# Patient Record
Sex: Female | Born: 1975
Health system: Southern US, Community
[De-identification: ages and names within clinical notes are randomized; demographics above are authoritative.]

## PROBLEM LIST (undated history)

## (undated) DIAGNOSIS — B977 Papillomavirus as the cause of diseases classified elsewhere: Secondary | ICD-10-CM

## (undated) DIAGNOSIS — E282 Polycystic ovarian syndrome: Secondary | ICD-10-CM

## (undated) DIAGNOSIS — J45909 Unspecified asthma, uncomplicated: Secondary | ICD-10-CM

## (undated) HISTORY — DX: Papillomavirus as the cause of diseases classified elsewhere: B97.7

## (undated) HISTORY — DX: Polycystic ovarian syndrome: E28.2

---
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
## 2001-09-07 DIAGNOSIS — E282 Polycystic ovarian syndrome: Secondary | ICD-10-CM

## 2001-09-07 DIAGNOSIS — B977 Papillomavirus as the cause of diseases classified elsewhere: Secondary | ICD-10-CM

## 2001-09-07 HISTORY — DX: Polycystic ovarian syndrome: E28.2

## 2001-09-07 HISTORY — DX: Papillomavirus as the cause of diseases classified elsewhere: B97.7

## 2010-01-27 ENCOUNTER — Encounter: Admission: RE | Admit: 2010-01-27 | Discharge: 2010-02-06 | Payer: Self-pay | Admitting: General Surgery

## 2010-02-03 ENCOUNTER — Ambulatory Visit (HOSPITAL_COMMUNITY)
Admission: RE | Admit: 2010-02-03 | Discharge: 2010-02-03 | Payer: Self-pay | Source: Home / Self Care | Admitting: General Surgery

## 2010-03-19 ENCOUNTER — Encounter
Admission: RE | Admit: 2010-03-19 | Discharge: 2010-03-19 | Payer: Self-pay | Source: Home / Self Care | Attending: General Surgery | Admitting: General Surgery

## 2010-04-07 ENCOUNTER — Ambulatory Visit (HOSPITAL_COMMUNITY)
Admission: RE | Admit: 2010-04-07 | Discharge: 2010-04-07 | Payer: Self-pay | Source: Home / Self Care | Admitting: General Surgery

## 2010-04-07 HISTORY — PX: LAPAROSCOPIC GASTRIC BANDING: SHX1100

## 2010-07-21 LAB — PREGNANCY, URINE: Preg Test, Ur: NEGATIVE

## 2010-07-21 LAB — COMPREHENSIVE METABOLIC PANEL
ALT: 22 U/L (ref 0–35)
Alkaline Phosphatase: 86 U/L (ref 39–117)
BUN: 10 mg/dL (ref 6–23)
CO2: 28 mEq/L (ref 19–32)
Chloride: 104 mEq/L (ref 96–112)
GFR calc non Af Amer: >60 mL/min (ref >60)
Glucose, Bld: 89 mg/dL (ref 70–99)
Potassium: 3.4 mEq/L — ABNORMAL LOW (ref 3.5–5.1)
Sodium: 140 mEq/L (ref 135–145)
Total Bilirubin: 0.4 mg/dL (ref 0.3–1.2)

## 2010-07-21 LAB — DIFFERENTIAL
Basophils Absolute: 0 10*3/uL (ref 0.0–0.1)
Basophils Relative: 0 % (ref 0–1)
Eosinophils Absolute: 0.3 10*3/uL (ref 0.0–0.7)
Monocytes Relative: 8 % (ref 3–12)
Neutrophils Relative %: 43 % (ref 43–77)

## 2010-07-21 LAB — CBC
HCT: 35.8 % — ABNORMAL LOW (ref 36.0–46.0)
Hemoglobin: 11.5 g/dL — ABNORMAL LOW (ref 12.0–15.0)
MCV: 73.6 fL — ABNORMAL LOW (ref 78.0–100.0)
WBC: 7.2 10*3/uL (ref 4.0–10.5)

## 2010-07-21 LAB — SURGICAL PCR SCREEN: MRSA, PCR: NEGATIVE

## 2010-11-13 ENCOUNTER — Encounter (INDEPENDENT_AMBULATORY_CARE_PROVIDER_SITE_OTHER): Payer: Self-pay

## 2010-12-11 ENCOUNTER — Encounter (INDEPENDENT_AMBULATORY_CARE_PROVIDER_SITE_OTHER): Payer: Self-pay

## 2011-01-08 ENCOUNTER — Encounter (INDEPENDENT_AMBULATORY_CARE_PROVIDER_SITE_OTHER): Payer: Self-pay

## 2011-01-08 ENCOUNTER — Ambulatory Visit (INDEPENDENT_AMBULATORY_CARE_PROVIDER_SITE_OTHER): Payer: BC Managed Care – PPO | Admitting: Physician Assistant

## 2011-01-08 NOTE — Progress Notes (Signed)
  HISTORY: AMAREE LOISEL is a 35 y.o.female who received an AP-Standard lap-band in November 2011 by Dr. Johna Sheriff. She comes in today with no complaints of hunger or increased portion sizes. She's doing well with 15 lbs weight loss since her last appointment. She does not desire an adjustment today.  VITAL SIGNS: Filed Vitals:   01/08/11 1554  BP: 128/88    PHYSICAL EXAM: Physical exam reveals a very well-appearing 35 y.o.female in no apparent distress Neurologic: Awake, alert, oriented Psych: Bright affect, conversant Respiratory: Breathing even and unlabored. No stridor or wheezing Extremities: Atraumatic, good range of motion. Skin: Warm, Dry, no rashes Musculoskeletal: Normal gait, Joints normal  ASSESMENT: 35 y.o.  female  s/p AP-Standard lap-band.   PLAN: She appears to be in the green zone with her current fill level so we deferred an adjustment today. We'll have her back in 2 months or sooner if necessary.

## 2011-01-08 NOTE — Patient Instructions (Signed)
Return in 2 months or sooner if needed. 

## 2011-03-05 ENCOUNTER — Encounter (INDEPENDENT_AMBULATORY_CARE_PROVIDER_SITE_OTHER): Payer: BC Managed Care – PPO

## 2011-03-19 ENCOUNTER — Telehealth (INDEPENDENT_AMBULATORY_CARE_PROVIDER_SITE_OTHER): Payer: Self-pay | Admitting: General Surgery

## 2011-03-19 NOTE — Telephone Encounter (Signed)
Called pt work #, left message on vmail. Proceeded to call pt at home number, reached pt and confirmed new appt time of 11:30 with patient. She took the day off from work and would have to see [place] after her already scheduled dr appt in [place]. Stated she could not come earlier because that appt was a block way from her home.

## 2011-03-19 NOTE — Telephone Encounter (Signed)
Called home number to try to advise pt of need to change lap band appt on 11/16. Was scheduled at 1:15 in error due to clinic only being a half day schedule.

## 2011-03-19 NOTE — Telephone Encounter (Signed)
Called and left vmail to advise pt of need to move 11/16 appt from 1:15 to the morning due to half day schedule.

## 2011-03-24 ENCOUNTER — Encounter (INDEPENDENT_AMBULATORY_CARE_PROVIDER_SITE_OTHER): Payer: Self-pay | Admitting: General Surgery

## 2011-03-26 ENCOUNTER — Encounter (INDEPENDENT_AMBULATORY_CARE_PROVIDER_SITE_OTHER): Payer: BC Managed Care – PPO

## 2011-03-26 ENCOUNTER — Encounter (INDEPENDENT_AMBULATORY_CARE_PROVIDER_SITE_OTHER): Payer: Self-pay

## 2011-03-26 ENCOUNTER — Ambulatory Visit (INDEPENDENT_AMBULATORY_CARE_PROVIDER_SITE_OTHER): Payer: BC Managed Care – PPO | Admitting: Physician Assistant

## 2011-03-26 VITALS — BP 126/84 | HR 76 | Resp 14 | Ht 68.0 in | Wt 209.4 lb

## 2011-03-26 DIAGNOSIS — Z9884 Bariatric surgery status: Secondary | ICD-10-CM

## 2011-03-26 DIAGNOSIS — E669 Obesity, unspecified: Secondary | ICD-10-CM

## 2011-03-26 NOTE — Progress Notes (Signed)
  HISTORY: TARISA PAOLA is a 35 y.o.female who received an AP-Standard lap-band in November 2011 by Dr. Johna Sheriff. She comes in today with no new complaints and no desire for a fill. She says she's hungry, but only during her menstrual period. No vomiting or regurgitation.  VITAL SIGNS: Filed Vitals:   03/26/11 1148  BP: 126/84  Pulse: 76  Resp: 14    PHYSICAL EXAM: Physical exam reveals a very well-appearing 35 y.o.female in no apparent distress Neurologic: Awake, alert, oriented Psych: Bright affect, conversant Respiratory: Breathing even and unlabored. No stridor or wheezing Extremities: Atraumatic, good range of motion. Skin: Warm, Dry, no rashes Musculoskeletal: Normal gait, Joints normal  ASSESMENT: 35 y.o.  female  s/p AP-Standard lap-band.   PLAN: She deferred an adjustment today as she continues to lose weight steadily. We'll have her back in 3 months or sooner if needed.

## 2011-03-26 NOTE — Patient Instructions (Signed)
Return in 3 months or sooner if needed.

## 2011-04-09 ENCOUNTER — Encounter (INDEPENDENT_AMBULATORY_CARE_PROVIDER_SITE_OTHER): Payer: BC Managed Care – PPO

## 2011-07-15 ENCOUNTER — Ambulatory Visit (INDEPENDENT_AMBULATORY_CARE_PROVIDER_SITE_OTHER): Payer: BC Managed Care – PPO | Admitting: Physician Assistant

## 2011-07-15 ENCOUNTER — Encounter (INDEPENDENT_AMBULATORY_CARE_PROVIDER_SITE_OTHER): Payer: Self-pay

## 2011-07-15 NOTE — Patient Instructions (Signed)
Return in six months or sooner if needed. 

## 2011-07-15 NOTE — Progress Notes (Signed)
  HISTORY: Desiree Black is a 36 y.o.female who received an AP-Standard lap-band in November 2011 by Dr. Johna Sheriff. She has no new complaints today. She denies persistent regurgitation symptoms beyond the sinus drainage issues she's had for several years. She's tolerating solids fine. She does not want to lose too much more weight but desired to tone up somewhat. She's aware that it requires increasing her physical activity.  VITAL SIGNS: Filed Vitals:   07/15/11 0945  BP: 120/72  Pulse: 66  Temp: 96.9 F (36.1 C)  Resp: 16    PHYSICAL EXAM: Physical exam reveals a very well-appearing 36 y.o.female in no apparent distress Neurologic: Awake, alert, oriented Psych: Bright affect, conversant Respiratory: Breathing even and unlabored. No stridor or wheezing Extremities: Atraumatic, good range of motion. Skin: Warm, Dry, no rashes Musculoskeletal: Normal gait, Joints normal  ASSESMENT: 36 y.o.  female  s/p AP-Standard lap-band.   PLAN: We deferred an adjustment today as she looks to be doing well with the current fill volume. We'll have her back in six months or sooner if needed. In the meantime I suggested she begin a physical activity program so she can better reach her goals.

## 2012-01-13 ENCOUNTER — Ambulatory Visit (INDEPENDENT_AMBULATORY_CARE_PROVIDER_SITE_OTHER): Payer: BC Managed Care – PPO | Admitting: Physician Assistant

## 2012-01-13 ENCOUNTER — Encounter (INDEPENDENT_AMBULATORY_CARE_PROVIDER_SITE_OTHER): Payer: Self-pay

## 2012-01-13 NOTE — Patient Instructions (Signed)
Return in 3 months or sooner if needed.

## 2012-01-13 NOTE — Progress Notes (Signed)
  HISTORY: Desiree Black is a 36 y.o.female who received an AP-Standard lap-band in December 2011 by Dr. Johna Sheriff. She comes in with no new complaints. Overall her hunger is under good control as are her portion sizes. She's engaged in a frequent exercise program. She denies persistent regurgitation or reflux. She does not want an adjustment today.  VITAL SIGNS: Filed Vitals:   01/13/12 0927  BP: 120/84  Pulse: 60  Temp: 97 F (36.1 C)  Resp: 14    PHYSICAL EXAM: Physical exam reveals a very well-appearing 36 y.o.female in no apparent distress Neurologic: Awake, alert, oriented Psych: Bright affect, conversant Respiratory: Breathing even and unlabored. No stridor or wheezing Extremities: Atraumatic, good range of motion. Skin: Warm, Dry, no rashes Musculoskeletal: Normal gait, Joints normal  ASSESMENT: 36 y.o.  female  s/p AP-Standard lap-band.   PLAN: We deferred an adjustment today as she still appears to be in the green zone. We'll have her back in three months or sooner if needed.

## 2012-02-09 ENCOUNTER — Encounter (HOSPITAL_COMMUNITY): Payer: Self-pay | Admitting: Cardiology

## 2012-02-09 ENCOUNTER — Emergency Department (HOSPITAL_COMMUNITY)
Admission: EM | Admit: 2012-02-09 | Discharge: 2012-02-09 | Disposition: A | Discharge status: Home / Self Care | Payer: BC Managed Care – PPO | Attending: Emergency Medicine | Admitting: Emergency Medicine

## 2012-02-09 ENCOUNTER — Emergency Department (HOSPITAL_COMMUNITY): Payer: BC Managed Care – PPO

## 2012-02-09 DIAGNOSIS — S161XXA Strain of muscle, fascia and tendon at neck level, initial encounter: Secondary | ICD-10-CM

## 2012-02-09 DIAGNOSIS — S139XXA Sprain of joints and ligaments of unspecified parts of neck, initial encounter: Secondary | ICD-10-CM | POA: Insufficient documentation

## 2012-02-09 MED ORDER — NAPROXEN 500 MG PO TABS: 500.0000 mg | ORAL_TABLET | Freq: Two times a day (BID) | ORAL | Status: DC | PRN

## 2012-02-09 MED ORDER — CYCLOBENZAPRINE HCL 10 MG PO TABS: 10.0000 mg | ORAL_TABLET | Freq: Three times a day (TID) | ORAL | Status: DC | PRN

## 2012-02-09 NOTE — ED Notes (Signed)
Pt ambulated in the hallway without any difficulty.
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
## 2012-04-20 ENCOUNTER — Encounter (INDEPENDENT_AMBULATORY_CARE_PROVIDER_SITE_OTHER): Payer: BC Managed Care – PPO

## 2012-05-25 ENCOUNTER — Encounter (INDEPENDENT_AMBULATORY_CARE_PROVIDER_SITE_OTHER): Payer: BC Managed Care – PPO

## 2013-02-19 ENCOUNTER — Encounter: Payer: Self-pay | Admitting: Nurse Practitioner

## 2013-02-19 ENCOUNTER — Ambulatory Visit (INDEPENDENT_AMBULATORY_CARE_PROVIDER_SITE_OTHER): Payer: BC Managed Care – PPO | Admitting: Nurse Practitioner

## 2013-02-19 VITALS — BP 118/88 | HR 80 | Ht 68.25 in | Wt 193.2 lb

## 2013-02-19 DIAGNOSIS — Z01419 Encounter for gynecological examination (general) (routine) without abnormal findings: Secondary | ICD-10-CM

## 2013-02-19 DIAGNOSIS — Z0000 Encounter for general adult medical examination without abnormal findings: Secondary | ICD-10-CM

## 2013-02-19 DIAGNOSIS — F3281 Premenstrual dysphoric disorder: Secondary | ICD-10-CM

## 2013-02-19 DIAGNOSIS — N943 Premenstrual tension syndrome: Secondary | ICD-10-CM

## 2013-02-22 ENCOUNTER — Encounter: Payer: Self-pay | Admitting: Nurse Practitioner

## 2013-02-22 DIAGNOSIS — F3281 Premenstrual dysphoric disorder: Secondary | ICD-10-CM | POA: Insufficient documentation

## 2013-02-22 NOTE — Progress Notes (Signed)
  Subjective:    Patient ID: Desiree Black, female    DOB: August 16, 1975, 37 y.o.   MRN: [id number]  HPI Presents for wellness checkup. Had lap band in 2012. Continues to lose weight. Cycles reg with heavy flow lasting 4-5 days. Same sexual partner. PCOS improved. Regular dental care. Intense PMS symptoms beginning about 2 weeks before cycle including agitation and emotional lability.     Review of Systems  Constitutional: Negative for activity change, appetite change and fatigue.  HENT: Negative for dental problem, hearing loss, rhinorrhea, sinus pressure and sore throat.   Eyes: Negative for visual disturbance.  Respiratory: Negative for cough, chest tightness, shortness of breath and wheezing.   Cardiovascular: Negative for chest pain.  Gastrointestinal: Negative for nausea, vomiting, abdominal pain, diarrhea, constipation and blood in stool.  Genitourinary: Negative for dysuria, urgency, frequency, vaginal discharge, difficulty urinating, menstrual problem and pelvic pain.  Psychiatric/Behavioral: Positive for dysphoric mood. Negative for suicidal ideas and sleep disturbance.       Objective:   Physical Exam  Vitals reviewed. Constitutional: She is oriented to person, place, and time. She appears well-developed. No distress.  HENT:  Right Ear: External ear normal.  Left Ear: External ear normal.  Mouth/Throat: Oropharynx is clear and moist.  Neck: Normal range of motion. Neck supple. No tracheal deviation present. No thyromegaly present.  Cardiovascular: Normal rate, regular rhythm and normal heart sounds.  Exam reveals no gallop.   No murmur heard. Pulmonary/Chest: Effort normal and breath sounds normal.  Abdominal: Soft. She exhibits no distension. There is no tenderness.  Genitourinary: Vagina normal and uterus normal. No vaginal discharge found.  Musculoskeletal: She exhibits no edema.  Lymphadenopathy:    She has no cervical adenopathy.  Neurological: She is alert and  oriented to person, place, and time.  Skin: Skin is warm and dry. No rash noted.  Psychiatric: She has a normal mood and affect. Her behavior is normal.  Breast exam: dense tissue; no masses; axillae no adenopathy. GU: external exam normal; vagina no discharge Bimanual exam: normal        Assessment & Plan:  Well woman exam  Routine general medical examination at a health care facility - Plan: Basic metabolic panel, Hepatic function panel, Lipid panel, TSH, Vit D  25 hydroxy (rtn osteoporosis monitoring)  PMDD (premenstrual dysphoric disorder)  Recommend daily calcium and vitamin D. Healthy diet. Regular activity. Next physical in one year. Consider medication for anxiety and PMDD. '

## 2013-11-06 ENCOUNTER — Other Ambulatory Visit: Payer: Self-pay | Admitting: Nurse Practitioner

## 2013-11-06 ENCOUNTER — Telehealth: Payer: Self-pay | Admitting: Nurse Practitioner

## 2013-11-06 MED ORDER — DROSPIRENONE-ETHINYL ESTRADIOL 3-0.02 MG PO TABS: 1.0000 | ORAL_TABLET | Freq: Every day | ORAL | Status: DC

## 2013-11-06 NOTE — Telephone Encounter (Signed)
Pt states that at her last appt she was supposed to get a script for  Yaz to help control her hormones   [facility]   Last seen 10/14

## 2013-11-07 NOTE — Telephone Encounter (Signed)
Med sent to pharmacy.

## 2013-11-16 ENCOUNTER — Other Ambulatory Visit: Payer: Self-pay | Admitting: *Deleted

## 2013-11-16 ENCOUNTER — Telehealth: Payer: Self-pay | Admitting: Family Medicine

## 2013-11-16 MED ORDER — DROSPIRENONE-ETHINYL ESTRADIOL 3-0.02 MG PO TABS: 1.0000 | ORAL_TABLET | Freq: Every day | ORAL | Status: DC

## 2013-11-16 NOTE — Telephone Encounter (Signed)
Patient called today to check on her Rx for birth control, but she had no idea it was called in because she waiting on a phone call. Can we recall this in to [facility]?

## 2013-11-16 NOTE — Telephone Encounter (Signed)
Med sent to pharm on June 30th. Pt notified. Med resent today incase pharm has put rx back.

## 2013-11-19 ENCOUNTER — Encounter: Payer: Self-pay | Admitting: Adult Health

## 2013-11-19 ENCOUNTER — Ambulatory Visit (INDEPENDENT_AMBULATORY_CARE_PROVIDER_SITE_OTHER): Payer: BC Managed Care – PPO | Admitting: Adult Health

## 2013-11-19 DIAGNOSIS — Z3201 Encounter for pregnancy test, result positive: Secondary | ICD-10-CM

## 2013-11-19 LAB — POCT URINE PREGNANCY: PREG TEST UR: POSITIVE

## 2013-11-19 NOTE — Addendum Note (Signed)
Addended by: Linton Rump on: 11/19/2013 03:45 PM   Modules accepted: Level of Service

## 2014-02-20 ENCOUNTER — Ambulatory Visit (INDEPENDENT_AMBULATORY_CARE_PROVIDER_SITE_OTHER): Payer: BC Managed Care – PPO | Admitting: Nurse Practitioner

## 2014-02-20 ENCOUNTER — Encounter: Payer: Self-pay | Admitting: Nurse Practitioner

## 2014-02-20 VITALS — BP 130/82 | Ht 68.25 in | Wt 180.0 lb

## 2014-02-20 DIAGNOSIS — Z3009 Encounter for other general counseling and advice on contraception: Secondary | ICD-10-CM

## 2014-02-20 DIAGNOSIS — S8001XA Contusion of right knee, initial encounter: Secondary | ICD-10-CM

## 2014-02-20 NOTE — Progress Notes (Signed)
Subjective:  Presents for complaints of a protruding area near the right kneecap. [place]. Had some swelling and pain initially, this has completely resolved. No pain with any type of activity. In July, patient terminated a pregnancy taking oral medication provided by [facility]. Had bleeding for about 3 weeks. Now having regular menses light flow lasting about 5 days. Decided not to take birth control pills, did not provide a specific reason. Very unsure whether she wants to have any children. Her current partner has 4 children. Is interested in Depo-Provera.  Objective:   BP 130/82  Ht 5' 8.25" (1.734 m)  Wt 180 lb (81.647 kg)  BMI 27.15 kg/m2 NAD. Alert, oriented. Right knee no edema. A small bony prominences noted beneath the patella and tibial tuberosity, minimal tenderness. Faint hyperpigmentation and mild keloid tissue noted from injury. Good ROM of the knee without tenderness, moderate crepitus noted in both knees.  Assessment: Contusion of right knee, initial encounter  Encounter for other general counseling or advice on contraception  Plan: Expect continued gradual resolution of slight protrusion on her knee, explained this is probably residual from her contusion. May take months for it to improve. Defers x-ray at this time since she is not having any symptoms. Discussed contraceptive options at length. Patient is very undecided about pregnancy issue. She has decided if she has a regular menses on 10/30 that she will schedule a nurse visit to get Depo-Provera.

## 2014-06-27 ENCOUNTER — Ambulatory Visit (INDEPENDENT_AMBULATORY_CARE_PROVIDER_SITE_OTHER): Payer: BLUE CROSS/BLUE SHIELD | Admitting: Nurse Practitioner

## 2014-06-27 ENCOUNTER — Encounter: Payer: Self-pay | Admitting: Nurse Practitioner

## 2014-06-27 VITALS — BP 130/82 | Ht 68.25 in | Wt 185.0 lb

## 2014-06-27 DIAGNOSIS — N946 Dysmenorrhea, unspecified: Secondary | ICD-10-CM

## 2014-06-27 DIAGNOSIS — N943 Premenstrual tension syndrome: Secondary | ICD-10-CM

## 2014-06-27 DIAGNOSIS — N920 Excessive and frequent menstruation with regular cycle: Secondary | ICD-10-CM

## 2014-06-27 DIAGNOSIS — D500 Iron deficiency anemia secondary to blood loss (chronic): Secondary | ICD-10-CM

## 2014-06-27 DIAGNOSIS — F3281 Premenstrual dysphoric disorder: Secondary | ICD-10-CM

## 2014-06-27 MED ORDER — MEDROXYPROGESTERONE ACETATE 150 MG/ML IM SUSP
150.0000 mg | Freq: Once | INTRAMUSCULAR | Status: AC
  Administered 2014-06-27: 150 mg via INTRAMUSCULAR

## 2014-06-27 NOTE — Patient Instructions (Signed)
Take iron twice a day with fruit juice; do not take with milk or tea  Iron-Rich Diet An iron-rich diet contains foods that are good sources of iron. Iron is an important mineral that helps your body produce hemoglobin. Hemoglobin is a protein in red blood cells that carries oxygen to the body's tissues. Sometimes, the iron level in your blood can be low. This may be caused by:  A lack of iron in your diet.  Blood loss.  Times of growth, such as during pregnancy or during a child's growth and development. Low levels of iron can cause a decrease in the number of red blood cells. This can result in iron deficiency anemia. Iron deficiency anemia symptoms include:  Tiredness.  Weakness.  Irritability.  Increased chance of infection. Here are some recommendations for daily iron intake:  Males older than 37 years of age need 8 mg of iron per day.  Women ages 30 to 61 need 18 mg of iron per day.  Pregnant women need 27 mg of iron per day, and women who are over 95 years of age and breastfeeding need 9 mg of iron per day.  Women over the age of 33 need 8 mg of iron per day. SOURCES OF IRON There are 2 types of iron that are found in food: heme iron and nonheme iron. Heme iron is absorbed by the body better than nonheme iron. Heme iron is found in meat, poultry, and fish. Nonheme iron is found in grains, beans, and vegetables. Heme Iron Sources Food / Iron (mg)  Chicken liver, 3 oz (85 g)/ 10 mg  Beef liver, 3 oz (85 g)/ 5.5 mg  Oysters, 3 oz (85 g)/ 8 mg  Beef, 3 oz (85 g)/ 2 to 3 mg  Shrimp, 3 oz (85 g)/ 2.8 mg  [place], 3 oz (85 g)/ 2 mg  Chicken, 3 oz (85 g) / 1 mg  Fish (tuna, halibut), 3 oz (85 g)/ 1 mg  Pork, 3 oz (85 g)/ 0.9 mg Nonheme Iron Sources Food / Iron (mg)  Ready-to-eat breakfast cereal, iron-fortified / 3.9 to 7 mg  Tofu,  cup / 3.4 mg  Kidney beans,  cup / 2.6 mg  Baked potato with skin / 2.7 mg  Asparagus,  cup / 2.2 mg  Avocado / 2  mg  Dried peaches,  cup / 1.6 mg  Raisins,  cup / 1.5 mg  Soy milk, 1 cup / 1.5 mg  Whole-wheat bread, 1 slice / 1.2 mg  Spinach, 1 cup / 0.8 mg  Broccoli,  cup / 0.6 mg IRON ABSORPTION Certain foods can decrease the body's absorption of iron. Try to avoid these foods and beverages while eating meals with iron-containing foods:  Coffee.  Tea.  Fiber.  Soy. Foods containing vitamin C can help increase the amount of iron your body absorbs from iron sources, especially from nonheme sources. Eat foods with vitamin C along with iron-containing foods to increase your iron absorption. Foods that are high in vitamin C include many fruits and vegetables. Some good sources are:  Fresh orange juice.  Oranges.  Strawberries.  Mangoes.  Grapefruit.  Red bell peppers.  Green bell peppers.  Broccoli.  Potatoes with skin.  Tomato juice. Document Released: 12/08/2004 Document Revised: 07/19/2011 Document Reviewed: 10/15/2010 [facility] Patient Information 2015 [place], [place]. This information is not intended to replace advice given to you by your health care provider. Make sure you discuss any questions you have with your health care provider.

## 2014-07-01 ENCOUNTER — Encounter: Payer: Self-pay | Admitting: Nurse Practitioner

## 2014-07-01 DIAGNOSIS — N920 Excessive and frequent menstruation with regular cycle: Secondary | ICD-10-CM | POA: Insufficient documentation

## 2014-07-01 DIAGNOSIS — D500 Iron deficiency anemia secondary to blood loss (chronic): Secondary | ICD-10-CM | POA: Insufficient documentation

## 2014-07-01 NOTE — Progress Notes (Signed)
Subjective:  Presents for recheck on PMDD. Regular cycles lasting 4-5 days, heavy flow and clots; cramping and pain. On second day of normal cycle. History of anemia due to heavy cycles.  Objective:   BP 130/82 mmHg  Ht 5' 8.25" (1.734 m)  Wt 185 lb (83.915 kg)  BMI 27.91 kg/m2 NAD. Alert, oriented. Lungs clear. Heart RRR. See scanned labs dated 05/20/14. Hgb 9.1.     Assessment:  Problem List Items Addressed This Visit      Other   PMDD (premenstrual dysphoric disorder) - Primary   Relevant Medications   medroxyPROGESTERone (DEPO-PROVERA) injection 150 mg (Completed)    Other Visit Diagnoses    Menorrhagia with regular cycle        Relevant Medications    medroxyPROGESTERone (DEPO-PROVERA) injection 150 mg (Completed)    Dysmenorrhea        Relevant Medications    medroxyPROGESTERone (DEPO-PROVERA) injection 150 mg (Completed)    Iron deficiency anemia due to chronic blood loss        Relevant Medications    medroxyPROGESTERone (DEPO-PROVERA) injection 150 mg (Completed)      Plan:  Meds ordered this encounter  Medications  . medroxyPROGESTERone (DEPO-PROVERA) injection 150 mg    Sig:    Start daily OTC iron therapy as directed. Call back if any heavy or prolonged bleeding. Will wait to see if this will resolve all of her issues including PMDD. Call back if any problems. Reminded about wellness exam.

## 2014-07-26 ENCOUNTER — Encounter: Payer: Self-pay | Admitting: Nurse Practitioner

## 2014-07-26 ENCOUNTER — Ambulatory Visit (INDEPENDENT_AMBULATORY_CARE_PROVIDER_SITE_OTHER): Payer: BLUE CROSS/BLUE SHIELD | Admitting: Nurse Practitioner

## 2014-07-26 VITALS — BP 140/90 | Ht 68.0 in | Wt 183.0 lb

## 2014-07-26 DIAGNOSIS — N943 Premenstrual tension syndrome: Secondary | ICD-10-CM

## 2014-07-26 DIAGNOSIS — F3281 Premenstrual dysphoric disorder: Secondary | ICD-10-CM

## 2014-07-26 DIAGNOSIS — D500 Iron deficiency anemia secondary to blood loss (chronic): Secondary | ICD-10-CM | POA: Diagnosis not present

## 2014-07-26 DIAGNOSIS — J302 Other seasonal allergic rhinitis: Secondary | ICD-10-CM

## 2014-07-26 LAB — POCT HEMOGLOBIN: Hemoglobin: 10.6 g/dL — AB (ref 12.2–16.2)

## 2014-07-26 NOTE — Patient Instructions (Signed)
Antihistamine Nasacort AQ as directed Zaditor eye drops

## 2014-07-27 ENCOUNTER — Encounter: Payer: Self-pay | Admitting: Nurse Practitioner

## 2014-07-27 NOTE — Progress Notes (Signed)
Subjective:  Presents for recheck. No cycle since starting Depo Provera. No PMDD symptoms. Less angry. Still having some issues due to stress from her job. States she "hates her job". Has applied for other jobs. Taking iron supplement. Mild head congestion. Ear pressure. Sneezing. No fever or cough. No headache.   Objective:   BP 140/90 mmHg  Ht 5\' 8"  (1.727 m)  Wt 183 lb (83.008 kg)  BMI 27.83 kg/m2  LMP 06/27/2014 NAD. Alert, oriented. TMs retracted; no erythema. Pharynx clear. Neck supple with mild adenopathy. Lungs clear. Heart RRR.  Results for orders placed or performed in visit on 07/26/14  POCT hemoglobin  Result Value Ref Range   Hemoglobin 10.6 (A) 12.2 - 16.2 g/dL   Last Hgb in 05/20/14 was 9.1.  Assessment:  Problem List Items Addressed This Visit      Other   PMDD (premenstrual dysphoric disorder)   Iron deficiency anemia due to chronic blood loss - Primary   Relevant Medications   Iron TABS   Other Relevant Orders   POCT hemoglobin (Completed)    Other Visit Diagnoses    Other seasonal allergic rhinitis          Plan: anemia has improved; continue iron supplement for at least 2 more months. Continue Depo Provera. OTC meds as directed for allergies.  Return if symptoms worsen or fail to improve.

## 2014-07-29 ENCOUNTER — Encounter: Payer: Self-pay | Admitting: *Deleted

## 2014-09-19 DIAGNOSIS — Z029 Encounter for administrative examinations, unspecified: Secondary | ICD-10-CM

## 2014-10-02 ENCOUNTER — Ambulatory Visit: Payer: BLUE CROSS/BLUE SHIELD | Admitting: Nurse Practitioner

## 2015-09-02 ENCOUNTER — Ambulatory Visit: Payer: BLUE CROSS/BLUE SHIELD | Admitting: Nurse Practitioner

## 2016-02-13 ENCOUNTER — Encounter (HOSPITAL_COMMUNITY): Payer: Self-pay

## 2016-11-08 ENCOUNTER — Encounter (HOSPITAL_COMMUNITY): Payer: Self-pay

## 2017-06-13 ENCOUNTER — Encounter: Payer: BLUE CROSS/BLUE SHIELD | Admitting: Nurse Practitioner

## 2017-06-29 ENCOUNTER — Encounter: Payer: Self-pay | Admitting: Nurse Practitioner

## 2017-06-29 ENCOUNTER — Ambulatory Visit (INDEPENDENT_AMBULATORY_CARE_PROVIDER_SITE_OTHER): Payer: BLUE CROSS/BLUE SHIELD | Admitting: Nurse Practitioner

## 2017-06-29 VITALS — BP 128/84 | Ht 68.0 in | Wt 178.2 lb

## 2017-06-29 DIAGNOSIS — D500 Iron deficiency anemia secondary to blood loss (chronic): Secondary | ICD-10-CM | POA: Diagnosis not present

## 2017-06-29 DIAGNOSIS — Z124 Encounter for screening for malignant neoplasm of cervix: Secondary | ICD-10-CM | POA: Diagnosis not present

## 2017-06-29 DIAGNOSIS — Z01419 Encounter for gynecological examination (general) (routine) without abnormal findings: Secondary | ICD-10-CM | POA: Diagnosis not present

## 2017-06-29 DIAGNOSIS — Z1151 Encounter for screening for human papillomavirus (HPV): Secondary | ICD-10-CM | POA: Diagnosis not present

## 2017-06-29 DIAGNOSIS — Z113 Encounter for screening for infections with a predominantly sexual mode of transmission: Secondary | ICD-10-CM | POA: Diagnosis not present

## 2017-06-29 DIAGNOSIS — D367 Benign neoplasm of other specified sites: Secondary | ICD-10-CM | POA: Diagnosis not present

## 2017-06-29 DIAGNOSIS — Z13220 Encounter for screening for lipoid disorders: Secondary | ICD-10-CM

## 2017-06-29 DIAGNOSIS — E559 Vitamin D deficiency, unspecified: Secondary | ICD-10-CM

## 2017-06-29 MED ORDER — NORETHINDRONE ACET-ETHINYL EST 1-20 MG-MCG PO TABS: 1.0000 | ORAL_TABLET | Freq: Every day | ORAL | 11 refills | Status: DC

## 2017-06-29 NOTE — Patient Instructions (Addendum)
For constipation increase fluid intake, start taking Benefiber tablets, eat foods with magnesium in them like pumpkin seeds, molasses, spinach, dark chocolate, bananas, and brown rice. For breast nodularity try to limit caffeine intake and get mammogram once you turn 40.  Oral Contraception Information Oral contraceptive pills (OCPs) are medicines taken to prevent pregnancy. OCPs work by preventing the ovaries from releasing eggs. The hormones in OCPs also cause the cervical mucus to thicken, preventing the sperm from entering the uterus. The hormones also cause the uterine lining to become thin, not allowing a fertilized egg to attach to the inside of the uterus. OCPs are highly effective when taken exactly as prescribed. However, OCPs do not prevent sexually transmitted diseases (STDs). Safe sex practices, such as using condoms along with the pill, can help prevent STDs. Before taking the pill, you may have a physical exam and Pap test. Your health care provider may order blood tests. The health care provider will make sure you are a good candidate for oral contraception. Discuss with your health care provider the possible side effects of the OCP you may be prescribed. When starting an OCP, it can take 2 to 3 months for the body to adjust to the changes in hormone levels in your body. Types of oral contraception  The combination pill-This pill contains estrogen and progestin (synthetic progesterone) hormones. The combination pill comes in 21-day, 28-day, or 91-day packs. Some types of combination pills are meant to be taken continuously (365-day pills). With 21-day packs, you do not take pills for 7 days after the last pill. With 28-day packs, the pill is taken every day. The last 7 pills are without hormones. Certain types of pills have more than 21 hormone-containing pills. With 91-day packs, the first 84 pills contain both hormones, and the last 7 pills contain no hormones or contain estrogen  only.  The minipill-This pill contains the progesterone hormone only. The pill is taken every day continuously. It is very important to take the pill at the same time each day. The minipill comes in packs of 28 pills. All 28 pills contain the hormone. Advantages of oral contraceptive pills  Decreases premenstrual symptoms.  Treats menstrual period cramps.  Regulates the menstrual cycle.  Decreases a heavy menstrual flow.  May treatacne, depending on the type of pill.  Treats abnormal uterine bleeding.  Treats polycystic ovarian syndrome.  Treats endometriosis.  Can be used as emergency contraception. Things that can make oral contraceptive pills less effective OCPs can be less effective if:  You forget to take the pill at the same time every day.  You have a stomach or intestinal disease that lessens the absorption of the pill.  You take OCPs with other medicines that make OCPs less effective, such as antibiotics, certain HIV medicines, and some seizure medicines.  You take expired OCPs.  You forget to restart the pill on day 7, when using the packs of 21 pills.  Risks associated with oral contraceptive pills Oral contraceptive pills can sometimes cause side effects, such as:  Headache.  Nausea.  Breast tenderness.  Irregular bleeding or spotting.  Combination pills are also associated with a small increased risk of:  Blood clots.  Heart attack.  Stroke.  This information is not intended to replace advice given to you by your health care provider. Make sure you discuss any questions you have with your health care provider. Document Released: 07/17/2002 Document Revised: 10/02/2015 Document Reviewed: 10/15/2012 Elsevier Interactive Patient Education  [employer].

## 2017-06-29 NOTE — Progress Notes (Signed)
Subjective:    Patient ID: Desiree Black, female    DOB: Aug 26, 1975, 42 y.o.   MRN: [id number]   CC: annual exam  HPI: 42 year old female presents here for annual exam.  She is concerned about "fatty tumor" on the back of her left thigh which she noticed two years ago.  It has not increased in size, there is no pain, redness, or swelling of the mass or the leg.  She attests to some mild itching in the mass that does not radiate anywhere.  She has not seen anyone about the mass.  She also attests to some constipation that occurs every one to two months.  She has pain with defecation and a small amount of blood noted on toilet paper after wiping.  She states that the constipation started after her weight loss surgery.  She has not tried anything to treat it.  Nothing makes it better or worse.  She would also like to discuss some hormone therapy to help control her premenstrual symptoms.  Past medical history: HPV, PCOS, PMDD, iron deficiency anemia due to chronic blood loss  Past Surgical History:  Procedure Laterality Date  . BREAST REDUCTION SURGERY  1998  . LAPAROSCOPIC GASTRIC BANDING  04/07/10   Accidents/injuries: none in the past three months Hospitalizations: none in the past three months  Current Outpatient Medications on File Prior to Visit  Medication Sig Dispense Refill  . Iron TABS Take by mouth.     No current facility-administered medications on file prior to visit.    Family History  Problem Relation Age of Onset  . Hypertension Mother   . Gout Maternal Grandmother   . Hypertension Maternal Grandmother   . Hypertension Brother   . Hypertension Sister    No Known Allergies   Immunizations: Has not had influenza vaccine.  Otherwise immunizations UTD.  Social History   Tobacco Use  . Smoking status: Never Smoker  . Smokeless tobacco: Never Used  Substance Use Topics  . Alcohol use: No  . Drug use: No   Sexual history: currently has no sexual  partners. Diet: reports that she eats pretty healthy and cooks for herself Travel: has not traveled outside the [place] in the last six months. Exercise: walks two blocks to work everyday;  Wants to start an exercise routine. LMP: about a month ago. Reports periods are regular, occurring about every month.  Her periods alternate between a light flow and a heavy flow every other month "like clockwork."    Reports she has premenstrual symptoms such as headache, fatigue, and breast tenderness up to two weeks before her cycle starts.   Review of Systems  Constitutional: Negative for appetite change.  Respiratory: Negative for cough, chest tightness, shortness of breath and wheezing.   Cardiovascular: Negative for chest pain and leg swelling.  Gastrointestinal: Positive for constipation. Negative for abdominal pain, diarrhea, nausea and vomiting.  Genitourinary: Negative for genital sores, pelvic pain, vaginal bleeding and vaginal discharge.       Objective:   Physical Exam   Blood pressure 128/84, height 5\' 8"  (1.727 m), weight 178 lb 3.2 oz (80.8 kg). Body mass index is 27.1 kg/m.  Constitutional:  Not in acute distress. HEENT: Ear canals clear. Bilateral TM: pearly gray, BLM visible.  R TM mildly retracted no erythema.  Thyroid isthmus midline, lobes palpable but not enlarged.  No palpable lymph nodes.   Skin:  19cm X 20cm mass on back of left thigh.  Mass is  soft, no tenderness on palpation.  No redness, edema, or difference in temperature of mass or skin around it.   Respiratory: Lungs clear no adventitious sounds. Cardiovascular:  S1S2 auscultated, no adventitious sounds. GI:  Active bowel sounds. No tenderness or masses on palpation. Non-distended. GU:  External genitalia intact no lesions, redness, edema, or exudate.  Vaginal mucosa pink.  Cervix friable with scant cloudy mucus like discharge. No CMT, no tenderness or obvious masses on bimanual exam. Breast:  Diffuse fine nodularity,  dense tissue palpated bilateral breasts.  More nodularity on left breast.  No large masses palpated. No swollen or tender lymph nodes in axillary area.  No pain on palpation.  Old surgical scars under each breast.  No erythema, swelling, or lesions.  No skin or nipple changes.       Assessment & Plan:   Problem List Items Addressed This Visit      Other   Iron deficiency anemia due to chronic blood loss   Relevant Orders   CBC with Differential   Vitamin D (25 hydroxy)   TSH   Basic Metabolic Panel (BMET)   Lipid Profile   Hepatic function panel    Other Visit Diagnoses    Well woman exam    -  Primary   Relevant Orders   Pap IG, CT/NG NAA, and HPV (high risk)   Need for lipid screening       Relevant Orders   CBC with Differential   Vitamin D (25 hydroxy)   TSH   Basic Metabolic Panel (BMET)   Lipid Profile   Hepatic function panel   Vitamin D deficiency       Relevant Orders   CBC with Differential   Vitamin D (25 hydroxy)   TSH   Basic Metabolic Panel (BMET)   Lipid Profile   Hepatic function panel   Screening for cervical cancer       Relevant Orders   Pap IG, CT/NG NAA, and HPV (high risk)   Screening for HPV (human papillomavirus)       Relevant Orders   Pap IG, CT/NG NAA, and HPV (high risk)   Screen for STD (sexually transmitted disease)       Relevant Orders   Pap IG, CT/NG NAA, and HPV (high risk)   Cyst, dermoid, leg, left       Relevant Orders   Ambulatory referral to General Surgery     Meds ordered this encounter  Medications  . norethindrone-ethinyl estradiol (MICROGESTIN,JUNEL,LOESTRIN) 1-20 MG-MC[facility] tablet    Sig: Take 1 tablet by mouth daily.    Dispense:  1 Package    Refill:  11    Order Specific Question:   Supervising Provider    Answer:   Mikey Kirschner [[id number]]   Referred to [facility] to assess mass on posterior left thigh.  Education:  Gave her information on oral contraceptive pills.  Counseled on natural remedies for constipation  such as increased fluid intake, exercise, eating more fiber, and eating foods high in magnesium. She can take Benefiber tablets as well. For breast nodularity try to limit caffeine intake and get mammogram once you turn 42. Discussed potential risks associated with oc use including risk of DVT. Non smoker.  Return in about 1 year (around 06/29/2018) for physical.

## 2017-07-01 ENCOUNTER — Other Ambulatory Visit: Payer: Self-pay | Admitting: Nurse Practitioner

## 2017-07-01 MED ORDER — AZITHROMYCIN 250 MG PO TABS: ORAL_TABLET | ORAL | 0 refills | Status: DC

## 2017-07-02 LAB — PAP IG, CT-NG NAA, HPV HIGH-RISK
Chlamydia, Nuc. Acid Amp: POSITIVE — AB
Gonococcus by Nucleic Acid Amp: NEGATIVE
HPV, HIGH-RISK: NEGATIVE
PAP Smear Comment: 0

## 2017-07-05 ENCOUNTER — Encounter: Payer: Self-pay | Admitting: Family Medicine

## 2017-08-08 DIAGNOSIS — R2240 Localized swelling, mass and lump, unspecified lower limb: Secondary | ICD-10-CM | POA: Diagnosis not present

## 2017-08-10 ENCOUNTER — Other Ambulatory Visit: Payer: Self-pay | Admitting: General Surgery

## 2017-08-10 DIAGNOSIS — R2242 Localized swelling, mass and lump, left lower limb: Secondary | ICD-10-CM

## 2017-08-25 ENCOUNTER — Ambulatory Visit
Admission: RE | Admit: 2017-08-25 | Discharge: 2017-08-25 | Disposition: A | Discharge status: Home / Self Care | Payer: BLUE CROSS/BLUE SHIELD | Source: Ambulatory Visit | Attending: General Surgery | Admitting: General Surgery

## 2017-08-25 DIAGNOSIS — R2242 Localized swelling, mass and lump, left lower limb: Secondary | ICD-10-CM

## 2017-08-25 DIAGNOSIS — D1721 Benign lipomatous neoplasm of skin and subcutaneous tissue of right arm: Secondary | ICD-10-CM | POA: Diagnosis not present

## 2017-08-25 MED ORDER — IOPAMIDOL (ISOVUE-300) INJECTION 61%
100.0000 mL | Freq: Once | INTRAVENOUS | Status: AC | PRN
  Administered 2017-08-25: 100 mL via INTRAVENOUS

## 2017-12-09 ENCOUNTER — Encounter: Payer: Self-pay | Admitting: Family Medicine

## 2017-12-09 ENCOUNTER — Ambulatory Visit: Payer: BLUE CROSS/BLUE SHIELD | Admitting: Family Medicine

## 2017-12-09 VITALS — BP 116/72 | Ht 68.0 in | Wt 180.4 lb

## 2017-12-09 DIAGNOSIS — S161XXA Strain of muscle, fascia and tendon at neck level, initial encounter: Secondary | ICD-10-CM
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
## 2018-06-28 IMAGING — CT CT FEMUR *L* W/ CM
1 series · 12 of 14 positions shown, 15 images · IV contrast (agent unspecified)
Comparison: None.

CONTRAST:  100mL JW5P79-APP IOPAMIDOL (JW5P79-APP) INJECTION 61%

CLINICAL DATA: Left posterior thigh bulging.  No pain.

EXAM:
CT OF THE LOWER RIGHT EXTREMITY WITH CONTRAST
TECHNIQUE: Multidetector CT imaging of the lower right extremity was performed
according to the standard protocol following intravenous contrast
administration.

[Series 4: soft tissue lower extremity · axial · 0.53mm/px · z∈[+520,+1008]mm · 12 of 290 slices shown, 15 images]
[im 23/290  soft-tissue]
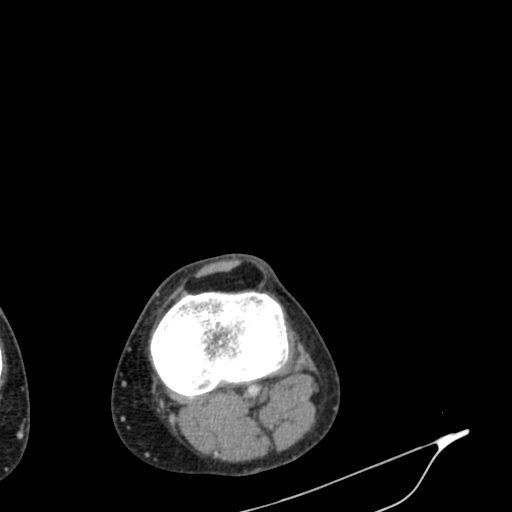
[im 23/290  bone]
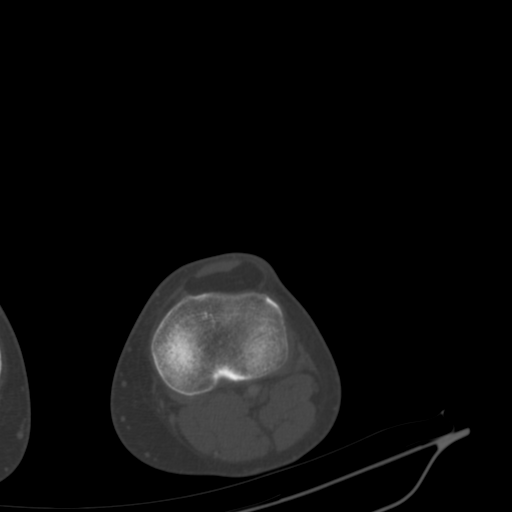
[im 45/290  bone]
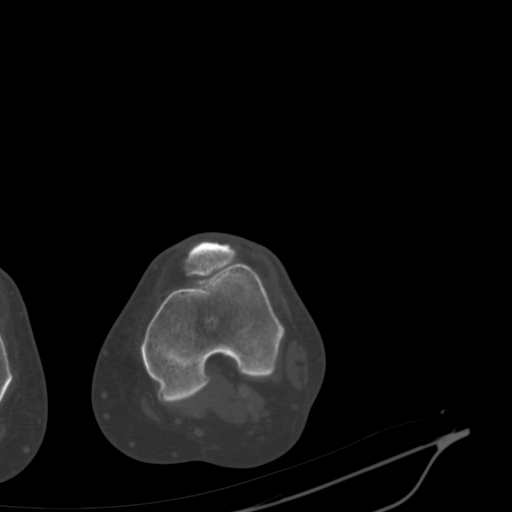
[im 67/290  bone]
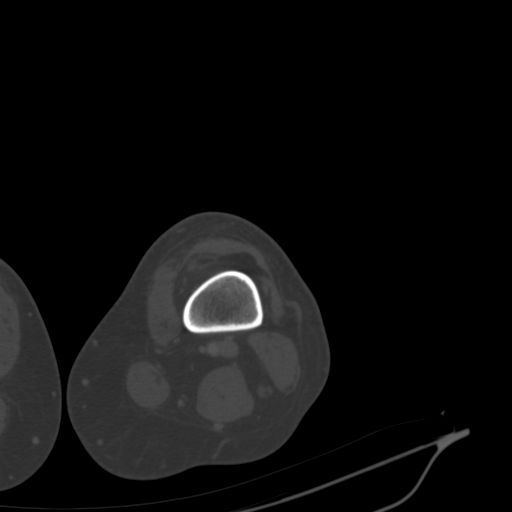
[im 89/290  bone]
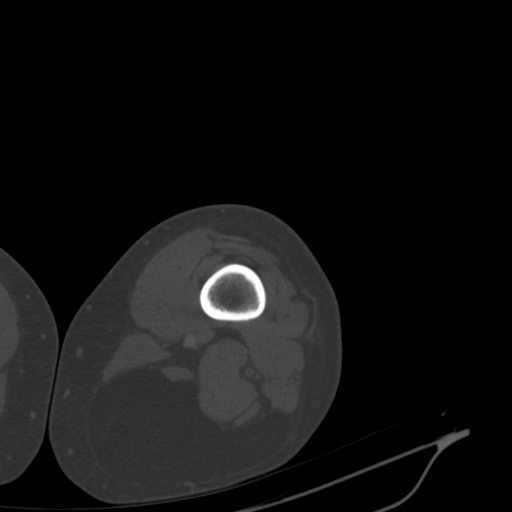
[im 112/290  soft-tissue]
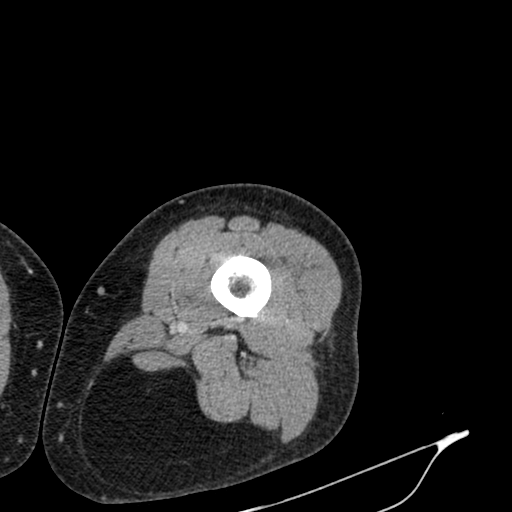
[im 112/290  bone]
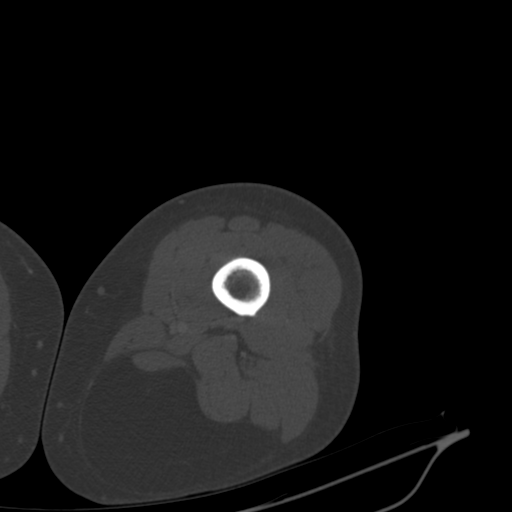
[im 134/290  bone]
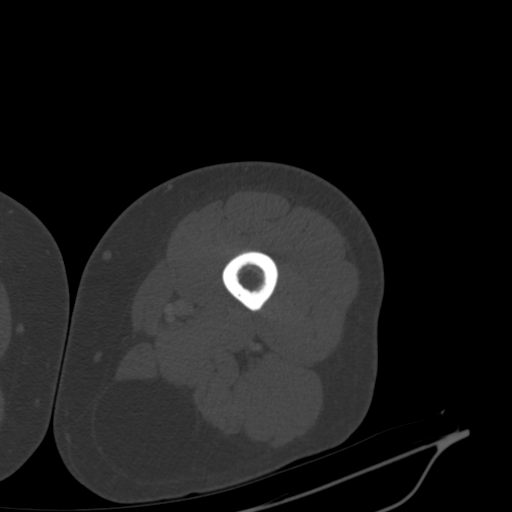
[im 156/290  bone]
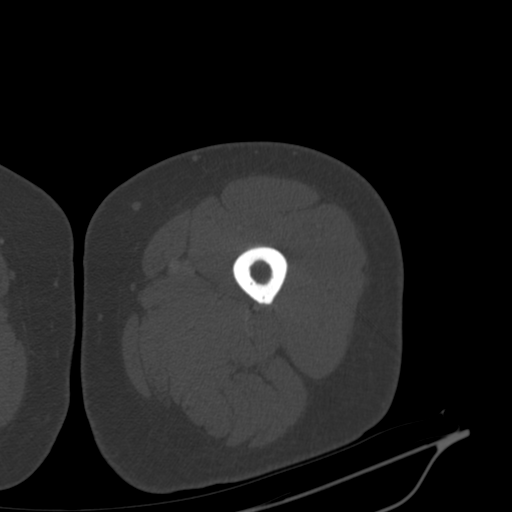
[im 178/290  bone]
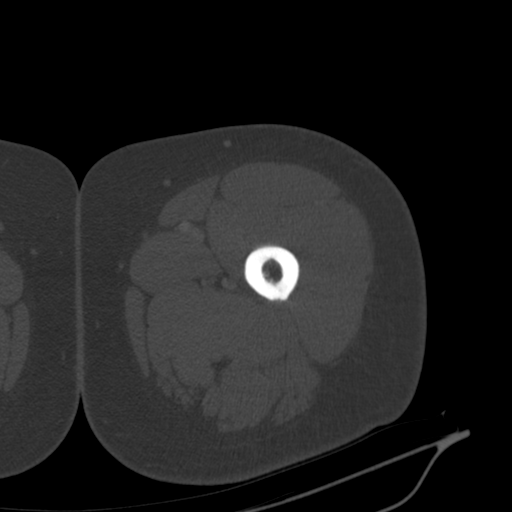
[im 201/290  soft-tissue]
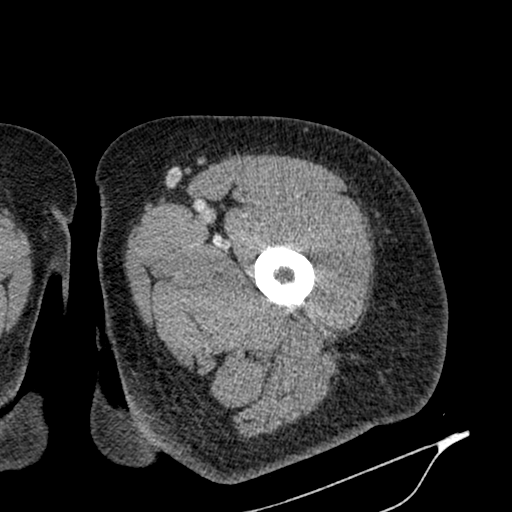
[im 201/290  bone]
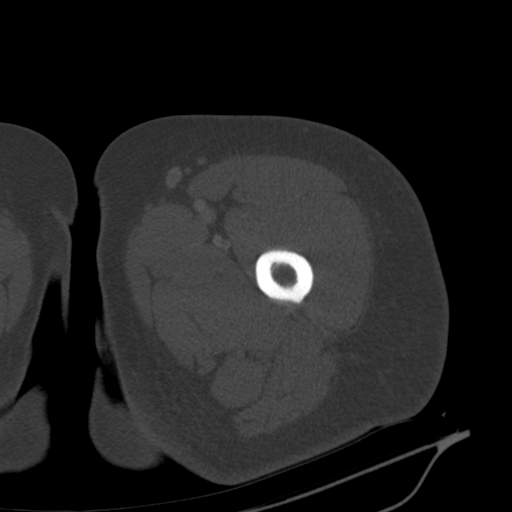
[im 223/290  bone]
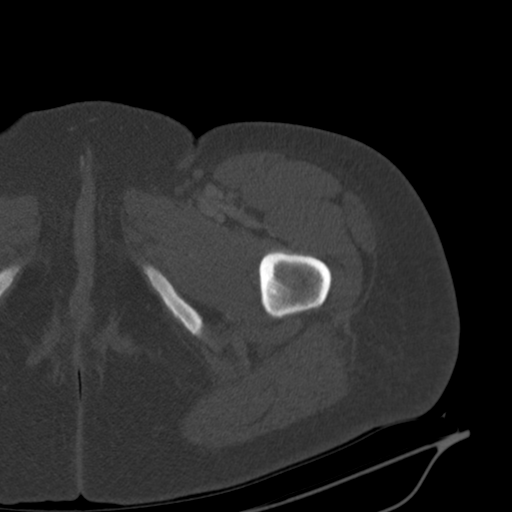
[im 245/290  bone]
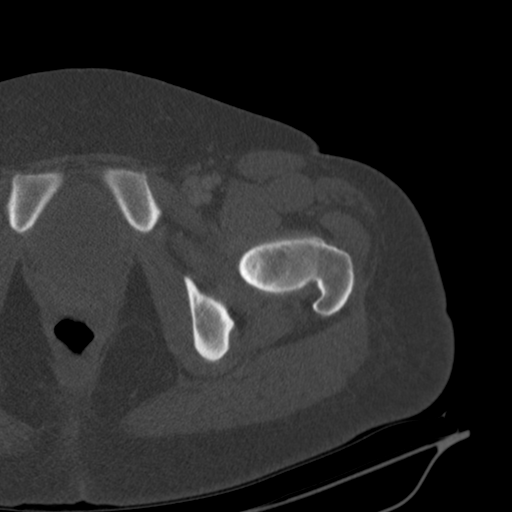
[im 267/290  bone]
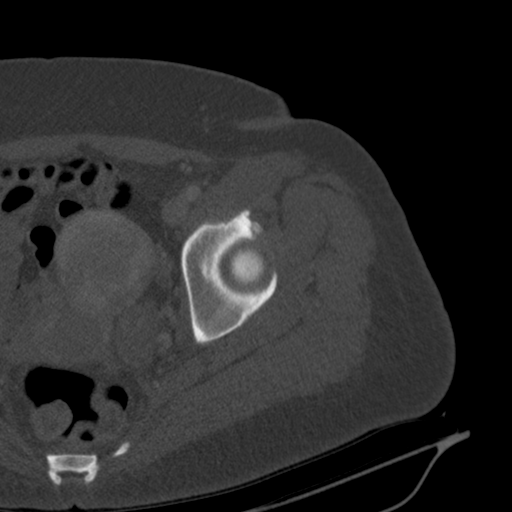

[12 of 14 positions shown; findings below may reference images not displayed]

FINDINGS: Bones/Joint/Cartilage

No fracture or dislocation. Normal alignment. No joint effusion.
Mild medial femoral

Ligaments

Ligaments are suboptimally evaluated by CT.

Muscles and Tendons
Muscles are normal.  No muscle atrophy.

Soft tissue
No fluid collection or hematoma. 10.2 x 7.3 x 16.2 cm fat
attenuating mass in the subcutaneous fat of the posterior left thigh
with a small calcified nodular component measuring 4.8 mm along the
lateral aspect.
IMPRESSION: 1. Large lipoma measuring 10.2 x 7.3 x 16.2 cm in the subcutaneous
fat of the left posterior thigh.

## 2018-07-28 ENCOUNTER — Encounter: Payer: BLUE CROSS/BLUE SHIELD | Admitting: Family Medicine

## 2018-12-27 ENCOUNTER — Encounter (HOSPITAL_BASED_OUTPATIENT_CLINIC_OR_DEPARTMENT_OTHER): Payer: Self-pay | Admitting: *Deleted

## 2018-12-27 ENCOUNTER — Other Ambulatory Visit: Payer: Self-pay

## 2018-12-27 DIAGNOSIS — D485 Neoplasm of uncertain behavior of skin: Secondary | ICD-10-CM | POA: Diagnosis not present

## 2018-12-28 ENCOUNTER — Other Ambulatory Visit (HOSPITAL_COMMUNITY)
Admission: RE | Admit: 2018-12-28 | Discharge: 2018-12-28 | Disposition: A | Discharge status: Home / Self Care | Payer: BC Managed Care – PPO | Source: Ambulatory Visit | Attending: Specialist | Admitting: Specialist

## 2018-12-28 DIAGNOSIS — Z20828 Contact with and (suspected) exposure to other viral communicable diseases: Secondary | ICD-10-CM | POA: Diagnosis not present

## 2018-12-28 DIAGNOSIS — Z01812 Encounter for preprocedural laboratory examination: Secondary | ICD-10-CM | POA: Insufficient documentation

## 2018-12-28 LAB — SARS CORONAVIRUS 2 (TAT 6-24 HRS): SARS Coronavirus 2: NEGATIVE

## 2019-01-01 ENCOUNTER — Encounter (HOSPITAL_BASED_OUTPATIENT_CLINIC_OR_DEPARTMENT_OTHER): Payer: Self-pay | Admitting: Anesthesiology

## 2019-01-01 ENCOUNTER — Ambulatory Visit (HOSPITAL_BASED_OUTPATIENT_CLINIC_OR_DEPARTMENT_OTHER): Payer: BC Managed Care – PPO | Admitting: Anesthesiology

## 2019-01-01 ENCOUNTER — Ambulatory Visit (HOSPITAL_BASED_OUTPATIENT_CLINIC_OR_DEPARTMENT_OTHER)
Admission: RE | Admit: 2019-01-01 | Discharge: 2019-01-01 | Disposition: A | Discharge status: Home / Self Care | Payer: BC Managed Care – PPO | Attending: Specialist | Admitting: Specialist

## 2019-01-01 ENCOUNTER — Encounter (HOSPITAL_BASED_OUTPATIENT_CLINIC_OR_DEPARTMENT_OTHER)
Admission: RE | Disposition: A | Discharge status: Home / Self Care | Payer: Self-pay | Source: Home / Self Care | Attending: Specialist

## 2019-01-01 ENCOUNTER — Other Ambulatory Visit: Payer: Self-pay

## 2019-01-01 DIAGNOSIS — D500 Iron deficiency anemia secondary to blood loss (chronic): Secondary | ICD-10-CM | POA: Diagnosis not present

## 2019-01-01 DIAGNOSIS — F329 Major depressive disorder, single episode, unspecified: Secondary | ICD-10-CM | POA: Diagnosis not present

## 2019-01-01 DIAGNOSIS — J45909 Unspecified asthma, uncomplicated: Secondary | ICD-10-CM | POA: Diagnosis not present

## 2019-01-01 DIAGNOSIS — D485 Neoplasm of uncertain behavior of skin: Secondary | ICD-10-CM | POA: Diagnosis not present

## 2019-01-01 DIAGNOSIS — R2242 Localized swelling, mass and lump, left lower limb: Secondary | ICD-10-CM | POA: Insufficient documentation

## 2019-01-01 HISTORY — PX: LIPOSUCTION: SHX10

## 2019-01-01 HISTORY — PX: MASS EXCISION: SHX2000

## 2019-01-01 HISTORY — DX: Unspecified asthma, uncomplicated: J45.909

## 2019-01-01 LAB — POCT PREGNANCY, URINE: Preg Test, Ur: NEGATIVE

## 2019-01-01 SURGERY — EXCISION MASS
Anesthesia: General | Site: Thigh | Laterality: Left

## 2019-01-01 MED ORDER — DEXAMETHASONE SODIUM PHOSPHATE 10 MG/ML IJ SOLN
INTRAMUSCULAR | Status: AC
  Filled 2019-01-01: qty 1

## 2019-01-01 MED ORDER — ACETAMINOPHEN 500 MG PO TABS: 1000.0000 mg | ORAL_TABLET | Freq: Once | ORAL | Status: DC | PRN

## 2019-01-01 MED ORDER — ACETAMINOPHEN 160 MG/5ML PO SOLN: 1000.0000 mg | Freq: Once | ORAL | Status: DC | PRN

## 2019-01-01 MED ORDER — MIDAZOLAM HCL 2 MG/2ML IJ SOLN: 1.0000 mg | INTRAMUSCULAR | Status: DC | PRN

## 2019-01-01 MED ORDER — FENTANYL CITRATE (PF) 100 MCG/2ML IJ SOLN
INTRAMUSCULAR | Status: DC | PRN
  Administered 2019-01-01: 50 ug via INTRAVENOUS

## 2019-01-01 MED ORDER — LIDOCAINE HCL (PF) 2 % IJ SOLN
INTRAMUSCULAR | Status: DC | PRN
  Administered 2019-01-01: 100 mL

## 2019-01-01 MED ORDER — MIDAZOLAM HCL 5 MG/5ML IJ SOLN
INTRAMUSCULAR | Status: DC | PRN
  Administered 2019-01-01: 2 mg via INTRAVENOUS

## 2019-01-01 MED ORDER — MIDAZOLAM HCL 2 MG/2ML IJ SOLN
INTRAMUSCULAR | Status: AC
  Filled 2019-01-01: qty 2

## 2019-01-01 MED ORDER — ONDANSETRON HCL 4 MG/2ML IJ SOLN
INTRAMUSCULAR | Status: AC
  Filled 2019-01-01: qty 2

## 2019-01-01 MED ORDER — FENTANYL CITRATE (PF) 100 MCG/2ML IJ SOLN: 50.0000 ug | INTRAMUSCULAR | Status: DC | PRN

## 2019-01-01 MED ORDER — SUCCINYLCHOLINE CHLORIDE 20 MG/ML IJ SOLN
INTRAMUSCULAR | Status: DC | PRN
  Administered 2019-01-01: 80 mg via INTRAVENOUS

## 2019-01-01 MED ORDER — EPINEPHRINE PF 1 MG/ML IJ SOLN
INTRAMUSCULAR | Status: DC | PRN
  Administered 2019-01-01: 1 mg

## 2019-01-01 MED ORDER — FENTANYL CITRATE (PF) 100 MCG/2ML IJ SOLN: 25.0000 ug | INTRAMUSCULAR | Status: DC | PRN

## 2019-01-01 MED ORDER — CHLORHEXIDINE GLUCONATE CLOTH 2 % EX PADS: 6.0000 | MEDICATED_PAD | Freq: Once | CUTANEOUS | Status: DC

## 2019-01-01 MED ORDER — CEFAZOLIN SODIUM-DEXTROSE 2-4 GM/100ML-% IV SOLN
2.0000 g | INTRAVENOUS | Status: AC
  Administered 2019-01-01: 2 g via INTRAVENOUS

## 2019-01-01 MED ORDER — SODIUM BICARBONATE 4 % IV SOLN
INTRAVENOUS | Status: DC | PRN
  Administered 2019-01-01: 5 mL

## 2019-01-01 MED ORDER — SUCCINYLCHOLINE CHLORIDE 200 MG/10ML IV SOSY
PREFILLED_SYRINGE | INTRAVENOUS | Status: AC
  Filled 2019-01-01: qty 10

## 2019-01-01 MED ORDER — OXYCODONE HCL 5 MG PO TABS: 5.0000 mg | ORAL_TABLET | Freq: Once | ORAL | Status: DC | PRN

## 2019-01-01 MED ORDER — PROPOFOL 10 MG/ML IV BOLUS
INTRAVENOUS | Status: AC
  Filled 2019-01-01: qty 20

## 2019-01-01 MED ORDER — ONDANSETRON HCL 4 MG/2ML IJ SOLN
INTRAMUSCULAR | Status: DC | PRN
  Administered 2019-01-01: 4 mg via INTRAVENOUS

## 2019-01-01 MED ORDER — OXYCODONE HCL 5 MG/5ML PO SOLN: 5.0000 mg | Freq: Once | ORAL | Status: DC | PRN

## 2019-01-01 MED ORDER — LACTATED RINGERS IV SOLN
INTRAVENOUS | Status: DC
  Administered 2019-01-01 (×2): via INTRAVENOUS

## 2019-01-01 MED ORDER — LACTATED RINGERS IV SOLN
INTRAVENOUS | Status: AC | PRN
  Administered 2019-01-01: 1000 mL

## 2019-01-01 MED ORDER — FENTANYL CITRATE (PF) 100 MCG/2ML IJ SOLN
INTRAMUSCULAR | Status: AC
  Filled 2019-01-01: qty 2

## 2019-01-01 MED ORDER — CEFAZOLIN SODIUM-DEXTROSE 2-4 GM/100ML-% IV SOLN
INTRAVENOUS | Status: AC
  Filled 2019-01-01: qty 100

## 2019-01-01 MED ORDER — ACETAMINOPHEN 10 MG/ML IV SOLN: 1000.0000 mg | Freq: Once | INTRAVENOUS | Status: DC | PRN

## 2019-01-01 MED ORDER — LIDOCAINE HCL (CARDIAC) PF 100 MG/5ML IV SOSY
PREFILLED_SYRINGE | INTRAVENOUS | Status: DC | PRN
  Administered 2019-01-01: 75 mg via INTRAVENOUS

## 2019-01-01 MED ORDER — PROPOFOL 10 MG/ML IV BOLUS
INTRAVENOUS | Status: DC | PRN
  Administered 2019-01-01: 160 mg via INTRAVENOUS

## 2019-01-01 MED ORDER — LIDOCAINE 2% (20 MG/ML) 5 ML SYRINGE
INTRAMUSCULAR | Status: AC
  Filled 2019-01-01: qty 5

## 2019-01-01 MED ORDER — DEXAMETHASONE SODIUM PHOSPHATE 4 MG/ML IJ SOLN
INTRAMUSCULAR | Status: DC | PRN
  Administered 2019-01-01: 10 mg via INTRAVENOUS

## 2019-01-01 SURGICAL SUPPLY — 91 items
APL SKNCLS STERI-STRIP NONHPOA (GAUZE/BANDAGES/DRESSINGS)
BAG DECANTER FOR FLEXI CONT (MISCELLANEOUS) IMPLANT
BALL CTTN LRG ABS STRL LF (GAUZE/BANDAGES/DRESSINGS)
BENZOIN TINCTURE PRP APPL 2/3 (GAUZE/BANDAGES/DRESSINGS) IMPLANT
BLADE HEX COATED 2.75 (ELECTRODE) IMPLANT
BLADE KNIFE PERSONA 10 (BLADE) ×1 IMPLANT
BLADE KNIFE PERSONA 15 (BLADE) ×3 IMPLANT
BNDG CMPR 9X4 STRL LF SNTH (GAUZE/BANDAGES/DRESSINGS) ×1
BNDG COHESIVE 4X5 TAN STRL (GAUZE/BANDAGES/DRESSINGS) IMPLANT
BNDG ESMARK 4X9 LF (GAUZE/BANDAGES/DRESSINGS) ×3 IMPLANT
BNDG GAUZE ELAST 4 BULKY (GAUZE/BANDAGES/DRESSINGS) IMPLANT
CANISTER SUCT 1200ML W/VALVE (MISCELLANEOUS) IMPLANT
CLEANER CAUTERY TIP 5X5 PAD (MISCELLANEOUS) IMPLANT
CLOSURE SKIN 1/8X3 (GAUZE/BANDAGES/DRESSINGS)
CLOSURE WOUND 1/2 X4 (GAUZE/BANDAGES/DRESSINGS)
COTTONBALL LRG STERILE PKG (GAUZE/BANDAGES/DRESSINGS) IMPLANT
COVER BACK TABLE REUSABLE LG (DRAPES) ×3 IMPLANT
COVER MAYO STAND REUSABLE (DRAPES) ×3 IMPLANT
COVER WAND RF STERILE (DRAPES) IMPLANT
DECANTER SPIKE VIAL GLASS SM (MISCELLANEOUS) IMPLANT
DRAPE HALF SHEET 70X43 (DRAPES) ×1 IMPLANT
DRAPE LAPAROSCOPIC ABDOMINAL (DRAPES) ×2 IMPLANT
DRAPE LAPAROTOMY 100X72 PEDS (DRAPES) IMPLANT
DRAPE SPLIT 6X30 W/TAPE (DRAPES) IMPLANT
DRSG PAD ABDOMINAL 8X10 ST (GAUZE/BANDAGES/DRESSINGS) ×3 IMPLANT
ELECT NDL BLADE 2-5/6 (NEEDLE) IMPLANT
ELECT NEEDLE BLADE 2-5/6 (NEEDLE) IMPLANT
ELECT REM PT RETURN 9FT ADLT (ELECTROSURGICAL) ×3
ELECTRODE REM PT RTRN 9FT ADLT (ELECTROSURGICAL) ×1 IMPLANT
GAUZE SPONGE 4X4 12PLY STRL (GAUZE/BANDAGES/DRESSINGS) ×3 IMPLANT
GAUZE SPONGE 4X4 12PLY STRL LF (GAUZE/BANDAGES/DRESSINGS) IMPLANT
GAUZE XEROFORM 1X8 LF (GAUZE/BANDAGES/DRESSINGS) ×3 IMPLANT
GAUZE XEROFORM 5X9 LF (GAUZE/BANDAGES/DRESSINGS) IMPLANT
GLOVE BIO SURGEON STRL SZ 6.5 (GLOVE) ×1 IMPLANT
GLOVE BIO SURGEONS STRL SZ 6.5 (GLOVE)
GLOVE BIOGEL M STRL SZ7.5 (GLOVE) ×3 IMPLANT
GLOVE BIOGEL PI IND STRL 8 (GLOVE) ×1 IMPLANT
GLOVE BIOGEL PI INDICATOR 8 (GLOVE) ×2
GLOVE ECLIPSE 7.0 STRL STRAW (GLOVE) ×3 IMPLANT
GOWN STRL REUS W/ TWL LRG LVL3 (GOWN DISPOSABLE) IMPLANT
GOWN STRL REUS W/ TWL XL LVL3 (GOWN DISPOSABLE) ×2 IMPLANT
GOWN STRL REUS W/TWL LRG LVL3 (GOWN DISPOSABLE)
GOWN STRL REUS W/TWL XL LVL3 (GOWN DISPOSABLE) ×6
IV LACTATED RINGERS 1000ML (IV SOLUTION) IMPLANT
IV NS 500ML (IV SOLUTION)
IV NS 500ML BAXH (IV SOLUTION) IMPLANT
LINER CANISTER 1000CC FLEX (MISCELLANEOUS) ×3 IMPLANT
MARKER SKIN DUAL TIP RULER LAB (MISCELLANEOUS) ×3 IMPLANT
NDL HYPO 25X1 1.5 SAFETY (NEEDLE) ×1 IMPLANT
NDL SAFETY ECLIPSE 18X1.5 (NEEDLE) IMPLANT
NDL SPNL 18GX3.5 QUINCKE PK (NEEDLE) ×1 IMPLANT
NEEDLE HYPO 18GX1.5 SHARP (NEEDLE)
NEEDLE HYPO 25X1 1.5 SAFETY (NEEDLE) ×3 IMPLANT
NEEDLE SPNL 18GX3.5 QUINCKE PK (NEEDLE) ×3 IMPLANT
PACK BASIN DAY SURGERY FS (CUSTOM PROCEDURE TRAY) ×3 IMPLANT
PAD CLEANER CAUTERY TIP 5X5 (MISCELLANEOUS)
PEN SKIN MARKING BROAD TIP (MISCELLANEOUS) ×3 IMPLANT
PENCIL BUTTON HOLSTER BLD 10FT (ELECTRODE) IMPLANT
SHEETING SILICONE GEL EPI DERM (MISCELLANEOUS) IMPLANT
SLEEVE SCD COMPRESS KNEE MED (MISCELLANEOUS) ×3 IMPLANT
SPONGE LAP 18X18 RF (DISPOSABLE) ×3 IMPLANT
STAPLER VISISTAT 35W (STAPLE) IMPLANT
STOCKINETTE 4X48 STRL (DRAPES) IMPLANT
STOCKINETTE IMPERVIOUS LG (DRAPES) IMPLANT
STRIP CLOSURE SKIN 1/2X4 (GAUZE/BANDAGES/DRESSINGS) IMPLANT
STRIP CLOSURE SKIN 1/8X3 (GAUZE/BANDAGES/DRESSINGS) IMPLANT
STRIP SUTURE WOUND CLOSURE 1/2 (SUTURE) IMPLANT
SUCTION FRAZIER HANDLE 10FR (MISCELLANEOUS)
SUCTION TUBE FRAZIER 10FR DISP (MISCELLANEOUS) IMPLANT
SUT ETHILON 3 0 PS 1 (SUTURE) IMPLANT
SUT MNCRL AB 3-0 PS2 18 (SUTURE) IMPLANT
SUT MON AB 2-0 CT1 36 (SUTURE) IMPLANT
SUT MON AB 5-0 PS2 18 (SUTURE) IMPLANT
SUT PROLENE 4 0 P 3 18 (SUTURE) IMPLANT
SUT PROLENE 4 0 PS 2 18 (SUTURE) IMPLANT
SUT VIC AB 0 CT1 27 (SUTURE)
SUT VIC AB 0 CT1 27XBRD ANBCTR (SUTURE) IMPLANT
SYR 20ML LL LF (SYRINGE) IMPLANT
SYR 50ML LL SCALE MARK (SYRINGE) ×2 IMPLANT
SYR CONTROL 10ML LL (SYRINGE) ×1 IMPLANT
TAPE HYPAFIX 6 X30' (GAUZE/BANDAGES/DRESSINGS)
TAPE HYPAFIX 6X30 (GAUZE/BANDAGES/DRESSINGS) IMPLANT
TOWEL GREEN STERILE FF (TOWEL DISPOSABLE) ×6 IMPLANT
TRAY DSU PREP LF (CUSTOM PROCEDURE TRAY) ×3 IMPLANT
TUBE CONNECTING 20'X1/4 (TUBING)
TUBE CONNECTING 20X1/4 (TUBING) IMPLANT
TUBING INFILTRATION IT-10001 (TUBING) ×2 IMPLANT
TUBING SET GRADUATE ASPIR 12FT (MISCELLANEOUS) ×3 IMPLANT
UNDERPAD 30X30 (UNDERPADS AND DIAPERS) IMPLANT
VAC PENCILS W/TUBING CLEAR (MISCELLANEOUS) ×2 IMPLANT
YANKAUER SUCT BULB TIP NO VENT (SUCTIONS) ×3 IMPLANT

## 2019-01-01 NOTE — Anesthesia Procedure Notes (Signed)
Procedure Name: Intubation Date/Time: 01/01/2019 9:23 AM Performed by: Marrianne Mood, CRNA Pre-anesthesia Checklist: Patient identified, Emergency Drugs available, Suction available, Patient being monitored and Timeout performed Patient Re-evaluated:Patient Re-evaluated prior to induction Oxygen Delivery Method: Circle system utilized Preoxygenation: Pre-oxygenation with 100% oxygen Induction Type: IV induction Ventilation: Mask ventilation without difficulty Laryngoscope Size: Miller and 3 Grade View: Grade II Tube type: Oral Tube size: 7.0 mm Number of attempts: 1 Airway Equipment and Method: Stylet and Oral airway Placement Confirmation: ETT inserted through vocal cords under direct vision,  positive ETCO2 and breath sounds checked- equal and bilateral Secured at: 20 cm Tube secured with: Tape Dental Injury: Teeth and Oropharynx as per pre-operative assessment

## 2019-01-01 NOTE — Op Note (Signed)
NAME: ZHANE, CERNY MEDICAL RECORD [id number] ACCOUNT [id number] DATE OF BIRTH:[birth year] FACILITY: [facility] LOCATION: [facility] PHYSICIAN:Zygmund Passero Octavia Heir, MD  OPERATIVE REPORT  DATE OF PROCEDURE:  01/01/2019  This is a 43 year old lady who has an expanding mass involving her left posterior thigh measuring approximately the size of a large football and 12 x 12.5 inches.  Increased growth, discomfort, especially when she walks and sits.  The area has not had  any skin breakdown; however, the lower part is getting thinner.  PROCEDURES PLANNED:  Excision of large mass liposuction assistance.  ANESTHESIA:  General.    OPERATIVE PROCEDURE:  Operative procedure in detail was explained to the patient preoperatively.  The patient underwent general anesthesia, intubated orally.  Prep was done.  She was then placed in a prone position and prep was done to the left anterior  thigh, left posterior thigh using Betadine soap and solution walled off with sterile towels and drapes so as to make a sterile field.  Tumescent solution was injected within the area approximately 700 mL was allowed to sit up and then liposuction  assistance was done at 12, 3, 6, and 9 o'clock positions after wound was opened somewhat.  I was able to remove 95% of the fatty tissue with good contour going to all 4 quadrants, removing approximately 1000 mL of fatty tissue.  After this, the wounds  were closed with 3-0 Monocryl x2 layers a running subcuticular stitch of 3-0 Monocryl.  The wounds were cleansed.  Half-inch Steri-Strips were placed, 4 x 4, ABDs, Hypafix Ace wraps x2.  She tolerated the procedures very well, was taken to recovery in  good condition.  Estimated blood loss less than 50 mL.  COMPLICATIONS:  None.  TN/NUANCE  D:01/01/2019 T:01/01/2019 JOB:[id number]

## 2019-01-01 NOTE — Anesthesia Procedure Notes (Signed)
Performed by: Marrianne Mood, CRNA

## 2019-01-01 NOTE — H&P (Signed)
Desiree Black is an 43 y.o. female.   Chief Complaint: Large mass left thigh HPI: Increased growth and discomfort  Past Medical History:  Diagnosis Date  . Asthma    none since weight loss  . HPV (human papilloma virus) infection 09/2001  . PCOS (polycystic ovarian syndrome) 09/2001    Past Surgical History:  Procedure Laterality Date  . BREAST REDUCTION SURGERY  1998  . LAPAROSCOPIC GASTRIC BANDING  04/07/10    Family History  Problem Relation Age of Onset  . Hypertension Mother   . Gout Maternal Grandmother   . Hypertension Maternal Grandmother   . Hypertension Brother   . Hypertension Sister    Social History:  reports that she has never smoked. She has never used smokeless tobacco. She reports that she does not drink alcohol or use drugs.  Allergies: No Known Allergies  No medications prior to admission.    No results found for this or any previous visit (from the past 48 hour(s)). No results found.  Review of Systems  Constitutional: Negative.   HENT: Negative.   Eyes: Negative.   Respiratory: Negative.   Cardiovascular: Negative.   Gastrointestinal: Negative.   Genitourinary: Negative.   Musculoskeletal: Negative.   Skin: Negative.   Neurological: Negative.   Endo/Heme/Allergies: Negative.   Psychiatric/Behavioral: Negative.     Height 5\' 8"  (1.727 m), weight 82.6 kg, last menstrual period 12/12/2018. Physical Exam  Constitutional: She appears well-developed and well-nourished.  HENT:  Head: Normocephalic and atraumatic.  Eyes: Pupils are equal, round, and reactive to light. EOM are normal.  Neck: Normal range of motion. No tracheal deviation present. No thyromegaly present.  Cardiovascular: Normal rate and normal heart sounds.  Respiratory: Effort normal and breath sounds normal.  GI: Soft. Bowel sounds are normal.  Musculoskeletal: Normal range of motion.  Neurological: She is alert.  Skin: Skin is warm.     Assessment/Plan Enlarging mass  left thigh for excision with liposuction assistance  Desiree Black L, MD 01/01/2019, 7:20 AM

## 2019-01-01 NOTE — Transfer of Care (Signed)
Immediate Anesthesia Transfer of Care Note  Patient: Desiree Black  Procedure(s) Performed: EXCISION MASS, left thigh (Left Thigh) LIPOSUCTION assistance (Left Thigh)  Patient Location: PACU  Anesthesia Type:General  Level of Consciousness: awake and patient cooperative  Airway & Oxygen Therapy: Patient Spontanous Breathing and Patient connected to face mask oxygen  Post-op Assessment: Report given to RN and Post -op Vital signs reviewed and stable  Post vital signs: Reviewed and stable  Last Vitals:  Vitals Value Taken Time  BP 150/92 01/01/19 1033  Temp    Pulse 80 01/01/19 1034  Resp 21 01/01/19 1034  SpO2 100 % 01/01/19 1034  Vitals shown include unvalidated device data.  Last Pain:  Vitals:   01/01/19 0824  TempSrc: Oral  PainSc: 0-No pain         Complications: No apparent anesthesia complications

## 2019-01-01 NOTE — Anesthesia Preprocedure Evaluation (Addendum)
Anesthesia Evaluation  Patient identified by MRN, date of birth, ID band Patient awake    Reviewed: Allergy & Precautions, NPO status , Patient's Chart, lab work & pertinent test results  Airway Mallampati: II  TM Distance: >3 FB Neck ROM: Full    Dental  (+) Dental Advisory Given   Pulmonary asthma , neg recent URI, Not current smoker,    breath sounds clear to auscultation       Cardiovascular negative cardio ROS   Rhythm:Regular     Neuro/Psych PSYCHIATRIC DISORDERS Depression negative neurological ROS     GI/Hepatic negative GI ROS, Neg liver ROS,   Endo/Other  negative endocrine ROS  Renal/GU negative Renal ROS     Musculoskeletal negative musculoskeletal ROS (+)   Abdominal   Peds  Hematology negative hematology ROS (+)   Anesthesia Other Findings   Reproductive/Obstetrics                             Anesthesia Physical Anesthesia Plan  ASA: II  Anesthesia Plan: General   Post-op Pain Management:    Induction: Intravenous  PONV Risk Score and Plan: 3 and Ondansetron and Dexamethasone  Airway Management Planned: Oral ETT  Additional Equipment: None  Intra-op Plan:   Post-operative Plan: Extubation in OR  Informed Consent: I have reviewed the patients History and Physical, chart, labs and discussed the procedure including the risks, benefits and alternatives for the proposed anesthesia with the patient or authorized representative who has indicated his/her understanding and acceptance.     Dental advisory given  Plan Discussed with: CRNA and Surgeon  Anesthesia Plan Comments:        Anesthesia Quick Evaluation

## 2019-01-01 NOTE — Discharge Instructions (Signed)
°  Post Anesthesia Home Care Instructions  Activity: Get plenty of rest for the remainder of the day. A responsible individual must stay with you for 24 hours following the procedure.  For the next 24 hours, DO NOT: -Drive a car -Paediatric nurse -Drink alcoholic beverages -Take any medication unless instructed by your physician -Make any legal decisions or sign important papers.  Meals: Start with liquid foods such as gelatin or soup. Progress to regular foods as tolerated. Avoid greasy, spicy, heavy foods. If nausea and/or vomiting occur, drink only clear liquids until the nausea and/or vomiting subsides. Call your physician if vomiting continues.  Special Instructions/Symptoms: Your throat may feel dry or sore from the anesthesia or the breathing tube placed in your throat during surgery. If this causes discomfort, gargle with warm salt water. The discomfort should disappear within 24 hours.  If you had a scopolamine patch placed behind your ear for the management of post- operative nausea and/or vomiting:  1. The medication in the patch is effective for 72 hours, after which it should be removed.  Wrap patch in a tissue and discard in the trash. Wash hands thoroughly with soap and water. 2. You may remove the patch earlier than 72 hours if you experience unpleasant side effects which may include dry mouth, dizziness or visual disturbances. 3. Avoid touching the patch. Wash your hands with soap and water after contact with the patch.      Activity (include date of return to work if known) As tolerated: NO showers NO driving No heavy activities  Diet:regular No restrictions:  Wound Care: Keep dressing clean & dry  Do not change dressings  Special Instructions:  Call Doctor if any unusual problems occur such as pain, excessive Bleeding, unrelieved Nausea/vomiting, Fever &/or chills  Follow-up appointment: Please call the office.  The patient received discharge instruction  from:___________________________________________   Patient signature ________________________________________ / Date___________    Signature of individual providing instructions/ Date________________

## 2019-01-01 NOTE — Brief Op Note (Signed)
01/01/2019  10:11 AM  PATIENT:  Desiree Black  43 y.o. female  PRE-OPERATIVE DIAGNOSIS:  large mass left thigh  POST-OPERATIVE DIAGNOSIS:  large mass left thigh  PROCEDURE:  Procedure(s): EXCISION MASS, left thigh (Left) LIPOSUCTION assistance (Left)  SURGEON:  Surgeon(s) and Role:    * Cristine Polio, MD - Primary  PHYSICIAN ASSISTANT:   ASSISTANTS: none   ANESTHESIA:   general  EBL:  25 mL   BLOOD ADMINISTERED:none  DRAINS: none   LOCAL MEDICATIONS USED:  LIDOCAINE   SPECIMEN:  Excision  DISPOSITION OF SPECIMEN:  PATHOLOGY  COUNTS:  NO [id number]  TOURNIQUET:  * No tourniquets in log *  DICTATION: .Dragon Dictation  PLAN OF CARE: Discharge to home after PACU  PATIENT DISPOSITION:  PACU - hemodynamically stable.   Delay start of Pharmacological VTE agent (>24hrs) due to surgical blood loss or risk of bleeding: not applicable

## 2019-01-02 NOTE — Anesthesia Postprocedure Evaluation (Signed)
Anesthesia Post Note  Patient: Desiree Black  Procedure(s) Performed: EXCISION MASS, left thigh (Left Thigh) LIPOSUCTION assistance (Left Thigh)     Patient location during evaluation: PACU Anesthesia Type: General Level of consciousness: awake and alert Pain management: pain level controlled Vital Signs Assessment: post-procedure vital signs reviewed and stable Respiratory status: spontaneous breathing, nonlabored ventilation, respiratory function stable and patient connected to nasal cannula oxygen Cardiovascular status: blood pressure returned to baseline and stable Postop Assessment: no apparent nausea or vomiting Anesthetic complications: no    Last Vitals:  Vitals:   01/01/19 1110 01/01/19 1145  BP: 130/86 (!) 141/88  Pulse:  73  Resp: 11 18  Temp:  37.1 C  SpO2: 100% 99%    Last Pain:  Vitals:   01/02/19 0935  TempSrc:   PainSc: 1                  Sylena Lotter

## 2019-01-03 ENCOUNTER — Encounter (HOSPITAL_BASED_OUTPATIENT_CLINIC_OR_DEPARTMENT_OTHER): Payer: Self-pay | Admitting: Specialist

## 2019-05-16 DIAGNOSIS — D485 Neoplasm of uncertain behavior of skin: Secondary | ICD-10-CM | POA: Diagnosis not present

## 2019-06-22 ENCOUNTER — Encounter: Payer: Self-pay | Admitting: Nurse Practitioner

## 2019-06-22 ENCOUNTER — Ambulatory Visit (INDEPENDENT_AMBULATORY_CARE_PROVIDER_SITE_OTHER): Payer: BC Managed Care – PPO | Admitting: Nurse Practitioner

## 2019-06-22 ENCOUNTER — Other Ambulatory Visit: Payer: Self-pay

## 2019-06-22 VITALS — BP 142/86 | Ht 67.25 in | Wt 182.6 lb

## 2019-06-22 DIAGNOSIS — R8290 Unspecified abnormal findings in urine: Secondary | ICD-10-CM | POA: Diagnosis not present

## 2019-06-22 DIAGNOSIS — Z0000 Encounter for general adult medical examination without abnormal findings: Secondary | ICD-10-CM | POA: Diagnosis not present

## 2019-06-22 DIAGNOSIS — Z113 Encounter for screening for infections with a predominantly sexual mode of transmission: Secondary | ICD-10-CM | POA: Diagnosis not present

## 2019-06-22 DIAGNOSIS — N390 Urinary tract infection, site not specified: Secondary | ICD-10-CM | POA: Diagnosis not present

## 2019-06-22 LAB — POCT URINALYSIS DIPSTICK
Blood, UA: POSITIVE
Nitrite, UA: POSITIVE
Spec Grav, UA: >=1.03 — AB (ref 1.010–1.025)
pH, UA: 5 (ref 5.0–8.0)

## 2019-06-22 MED ORDER — ONDANSETRON 4 MG PO TBDP: 4.0000 mg | ORAL_TABLET | Freq: Three times a day (TID) | ORAL | 0 refills | Status: DC | PRN

## 2019-06-22 MED ORDER — SULFAMETHOXAZOLE-TRIMETHOPRIM 800-160 MG PO TABS: 1.0000 | ORAL_TABLET | Freq: Two times a day (BID) | ORAL | 0 refills | Status: DC

## 2019-06-22 NOTE — Progress Notes (Signed)
Subjective:    Patient ID: Desiree Black, female    DOB: January 21, 1976, 44 y.o.   MRN: [id number]  HPI The patient comes in today for a wellness visit.    A review of their health history was completed.  A review of medications was also completed.  Any needed refills; not on any meds   Eating habits: health conscious; has been eating healthy.  Falls/  MVA accidents in past few months: none  Regular exercise: not currently  Specialist pt sees on regular basis: none  Preventative health issues were discussed.   Additional concerns: odor to urine.   Menses regular, every other month heavy alternating with light flow.  A week before her cycle notices increased reflux with some vomiting and what she describes as severe PMS symptoms.  Limited activity.  Minimal caffeine intake, drinks mostly water.  Still has her lap band.  Is considering having it removed, plans to make an appointment with her surgeon.  Had one new partner back in November.  Slight urinary odor.  No dysuria urgency or frequency.  No pelvic pain.  Did not use a condom.  Had a recent normal cycle.   Review of Systems  Constitutional: Negative for activity change, appetite change and fatigue.  HENT: Negative for sore throat and trouble swallowing.   Respiratory: Negative for cough, chest tightness, shortness of breath and wheezing.   Cardiovascular: Negative for chest pain.  Gastrointestinal: Positive for nausea and vomiting. Negative for abdominal distention, abdominal pain, constipation and diarrhea.       Nausea vomiting and reflux only around the time of her cycle.  Genitourinary: Negative for difficulty urinating, dyspareunia, dysuria, enuresis, flank pain, frequency, genital sores, menstrual problem, pelvic pain, urgency and vaginal discharge.       Complaints of urinary odor but no other symptoms.  Has been there for a while.  No mid back or flank pain.   Depression screen [facility] 2/9 06/22/2019 06/29/2017    Decreased Interest 0 0  Down, Depressed, Hopeless 0 0  PHQ - 2 Score 0 0       Objective:   Physical Exam Constitutional:      General: She is not in acute distress.    Appearance: Normal appearance. She is well-developed.  Neck:     Thyroid: No thyromegaly.     Trachea: No tracheal deviation.     Comments: Thyroid: Nontender to palpation, no mass or goiter noted. Cardiovascular:     Rate and Rhythm: Normal rate and regular rhythm.     Heart sounds: Normal heart sounds. No murmur. No gallop.   Pulmonary:     Effort: Pulmonary effort is normal.     Breath sounds: Normal breath sounds.  Abdominal:     General: There is no distension.     Palpations: Abdomen is soft. There is no mass.     Tenderness: There is no abdominal tenderness. There is no guarding.  Genitourinary:    Vagina: Normal. No vaginal discharge.     Comments: External GU no rashes or lesions.  No erythema.  Vagina tissue pink, no discharge.  Cervix normal limit in appearance.  Bimanual exam no tenderness or obvious masses.  No CVA tenderness on exam. Musculoskeletal:     Cervical back: Normal range of motion and neck supple.  Lymphadenopathy:     Cervical: No cervical adenopathy.  Skin:    General: Skin is warm and dry.     Findings: No rash.  Neurological:  Mental Status: She is alert and oriented to person, place, and time.  Psychiatric:        Mood and Affect: Mood normal.        Behavior: Behavior normal.        Thought Content: Thought content normal.        Judgment: Judgment normal.    UA: pos for blood; pos for nitrites; 1+ leukocytes Urine micro: moderate epis; rare RBC; few bacteria; >5 WBC per HPF     Assessment & Plan:  Routine general medical examination at a health care facility - Plan: HIV Antibody (routine testing w rflx), RPR, Basic metabolic panel, Lipid panel, Hepatic function panel, VITAMIN D 25 Hydroxy (Vit-D Deficiency, Fractures), TSH, CBC with Differential/Platelet  Screen  for STD (sexually transmitted disease) - Plan: HIV Antibody (routine testing w rflx), RPR, Chlamydia/Gonococcus/Trichomonas, NAA  Abnormal urine odor - Plan: POCT urinalysis dipstick  Acute lower UTI   Meds ordered this encounter  Medications  . ondansetron (ZOFRAN ODT) 4 MG disintegrating tablet    Sig: Take 1 tablet (4 mg total) by mouth every 8 (eight) hours as needed for nausea or vomiting.    Dispense:  30 tablet    Refill:  0    Order Specific Question:   Supervising Provider    Answer:   Sallee Lange A [[id number]]  . sulfamethoxazole-trimethoprim (BACTRIM DS) 800-160 MG tablet    Sig: Take 1 tablet by mouth 2 (two) times daily.    Dispense:  14 tablet    Refill:  0    Order Specific Question:   Supervising Provider    Answer:   Sallee Lange A [[id number]]   Defers contraceptives at this time.  May be planning conception in the next 1 to 2 years depending on whether she is in a relationship.  Plans to practice abstinence for now.  Patient to call back if she wishes to start some form of contraceptive such as birth control pills.  Discussed safe sex issues. Encouraged regular activity. Recommend OTC generic Pepcid and Zofran the week before her cycle.  Recommend follow-up with her surgeon regarding issues with her LAP-BAND. Start antibiotic as directed for UTI.  Warning signs reviewed.  Call back in 72 hours if no improvement, go to ED or urgent care sooner if worse. Routine labs pending. BP was slightly elevated today, recommend patient check this outside the office and send results through my chart. Return in about 1 year (around 06/21/2020) for physical.

## 2019-06-23 LAB — BASIC METABOLIC PANEL
BUN/Creatinine Ratio: 6 — ABNORMAL LOW (ref 9–23)
BUN: 5 mg/dL — ABNORMAL LOW (ref 6–24)
CO2: 22 mmol/L (ref 20–29)
Calcium: 9 mg/dL (ref 8.7–10.2)
Chloride: 105 mmol/L (ref 96–106)
Creatinine, Ser: 0.9 mg/dL (ref 0.57–1.00)
GFR calc Af Amer: 92 mL/min/{1.73_m2} (ref >59)
GFR calc non Af Amer: 80 mL/min/{1.73_m2} (ref >59)
Glucose: 84 mg/dL (ref 65–99)
Potassium: 3.6 mmol/L (ref 3.5–5.2)
Sodium: 141 mmol/L (ref 134–144)

## 2019-06-23 LAB — VITAMIN D 25 HYDROXY (VIT D DEFICIENCY, FRACTURES): Vit D, 25-Hydroxy: 6.2 ng/mL — ABNORMAL LOW (ref 30.0–100.0)

## 2019-06-23 LAB — LIPID PANEL
Chol/HDL Ratio: 3.9 ratio (ref 0.0–4.4)
Cholesterol, Total: 188 mg/dL (ref 100–199)
HDL: 48 mg/dL (ref >39)
LDL Chol Calc (NIH): 119 mg/dL — ABNORMAL HIGH (ref 0–99)
Triglycerides: 119 mg/dL (ref 0–149)
VLDL Cholesterol Cal: 21 mg/dL (ref 5–40)

## 2019-06-23 LAB — HEPATIC FUNCTION PANEL
ALT: 6 IU/L (ref 0–32)
AST: 13 IU/L (ref 0–40)
Albumin: 4.3 g/dL (ref 3.8–4.8)
Alkaline Phosphatase: 82 IU/L (ref 39–117)
Bilirubin Total: 0.5 mg/dL (ref 0.0–1.2)
Bilirubin, Direct: 0.11 mg/dL (ref 0.00–0.40)
Total Protein: 7.1 g/dL (ref 6.0–8.5)

## 2019-06-23 LAB — CBC WITH DIFFERENTIAL/PLATELET
Basophils Absolute: 0.1 10*3/uL (ref 0.0–0.2)
Basos: 1 %
EOS (ABSOLUTE): 0.1 10*3/uL (ref 0.0–0.4)
Eos: 2 %
Hematocrit: 35.1 % (ref 34.0–46.6)
Hemoglobin: 10.1 g/dL — ABNORMAL LOW (ref 11.1–15.9)
Immature Grans (Abs): 0 10*3/uL (ref 0.0–0.1)
Immature Granulocytes: 0 %
Lymphocytes Absolute: 1.9 10*3/uL (ref 0.7–3.1)
Lymphs: 30 %
MCH: 20 pg — ABNORMAL LOW (ref 26.6–33.0)
MCHC: 28.8 g/dL — ABNORMAL LOW (ref 31.5–35.7)
MCV: 69 fL — ABNORMAL LOW (ref 79–97)
Monocytes Absolute: 0.5 10*3/uL (ref 0.1–0.9)
Monocytes: 8 %
Neutrophils Absolute: 3.6 10*3/uL (ref 1.4–7.0)
Neutrophils: 59 %
Platelets: 277 10*3/uL (ref 150–450)
RBC: 5.06 x10E6/uL (ref 3.77–5.28)
RDW: 16.3 % — ABNORMAL HIGH (ref 11.7–15.4)
WBC: 6.2 10*3/uL (ref 3.4–10.8)

## 2019-06-23 LAB — RPR: RPR Ser Ql: NONREACTIVE

## 2019-06-23 LAB — TSH: TSH: 2.05 u[IU]/mL (ref 0.450–4.500)

## 2019-06-23 LAB — HIV ANTIBODY (ROUTINE TESTING W REFLEX): HIV Screen 4th Generation wRfx: NONREACTIVE

## 2019-06-24 ENCOUNTER — Other Ambulatory Visit: Payer: Self-pay | Admitting: Nurse Practitioner

## 2019-06-24 ENCOUNTER — Encounter: Payer: Self-pay | Admitting: Nurse Practitioner

## 2019-06-24 DIAGNOSIS — E559 Vitamin D deficiency, unspecified: Secondary | ICD-10-CM

## 2019-06-24 MED ORDER — VITAMIN D (ERGOCALCIFEROL) 1.25 MG (50000 UNIT) PO CAPS: 50000.0000 [IU] | ORAL_CAPSULE | ORAL | 2 refills | Status: DC

## 2019-06-25 LAB — CHLAMYDIA/GONOCOCCUS/TRICHOMONAS, NAA
Chlamydia by NAA: NEGATIVE
Gonococcus by NAA: NEGATIVE
Trich vag by NAA: NEGATIVE

## 2020-05-22 ENCOUNTER — Telehealth: Payer: Self-pay | Admitting: Family Medicine

## 2020-05-22 NOTE — Telephone Encounter (Signed)
Last labs 06/2019: Lipid, Liver, Vit D, Met 7, TSH, CBC

## 2020-05-22 NOTE — Telephone Encounter (Signed)
Patient has physical on 2/18 and needing labs done

## 2020-05-23 ENCOUNTER — Other Ambulatory Visit: Payer: Self-pay | Admitting: *Deleted

## 2020-05-23 DIAGNOSIS — Z1329 Encounter for screening for other suspected endocrine disorder: Secondary | ICD-10-CM

## 2020-05-23 DIAGNOSIS — Z79899 Other long term (current) drug therapy: Secondary | ICD-10-CM

## 2020-05-23 DIAGNOSIS — E559 Vitamin D deficiency, unspecified: Secondary | ICD-10-CM

## 2020-05-23 DIAGNOSIS — R5383 Other fatigue: Secondary | ICD-10-CM

## 2020-05-23 DIAGNOSIS — Z13220 Encounter for screening for lipoid disorders: Secondary | ICD-10-CM

## 2020-05-23 NOTE — Telephone Encounter (Signed)
Labs ordered, patient notified

## 2020-05-23 NOTE — Telephone Encounter (Signed)
Please reorder the same labs. Thanks.

## 2020-06-02 ENCOUNTER — Telehealth: Payer: Self-pay | Admitting: *Deleted

## 2020-06-02 NOTE — Telephone Encounter (Signed)
Pt left on vm requesting asking if rx could be called in for chyladmia or if she needed to be seen first. Pt does need to be seen first. Please call and schedule pt an appt this week.

## 2020-06-06 ENCOUNTER — Other Ambulatory Visit: Payer: Self-pay

## 2020-06-06 ENCOUNTER — Ambulatory Visit (INDEPENDENT_AMBULATORY_CARE_PROVIDER_SITE_OTHER): Payer: 59 | Admitting: Family Medicine

## 2020-06-06 ENCOUNTER — Encounter: Payer: Self-pay | Admitting: Family Medicine

## 2020-06-06 VITALS — BP 110/74 | HR 92 | Temp 97.3°F | Ht 67.0 in | Wt 187.0 lb

## 2020-06-06 DIAGNOSIS — N3001 Acute cystitis with hematuria: Secondary | ICD-10-CM

## 2020-06-06 DIAGNOSIS — B379 Candidiasis, unspecified: Secondary | ICD-10-CM | POA: Diagnosis not present

## 2020-06-06 DIAGNOSIS — Z202 Contact with and (suspected) exposure to infections with a predominantly sexual mode of transmission: Secondary | ICD-10-CM | POA: Diagnosis not present

## 2020-06-06 LAB — POCT URINALYSIS DIPSTICK
Blood, UA: POSITIVE
Protein, UA: POSITIVE — AB
Spec Grav, UA: <=1.005 — AB (ref 1.010–1.025)
pH, UA: 5 (ref 5.0–8.0)

## 2020-06-06 LAB — POCT URINE PREGNANCY: Preg Test, Ur: NEGATIVE

## 2020-06-06 MED ORDER — NITROFURANTOIN MONOHYD MACRO 100 MG PO CAPS: 100.0000 mg | ORAL_CAPSULE | Freq: Two times a day (BID) | ORAL | 0 refills | Status: DC

## 2020-06-06 MED ORDER — DOXYCYCLINE HYCLATE 100 MG PO TABS: 100.0000 mg | ORAL_TABLET | Freq: Two times a day (BID) | ORAL | 0 refills | Status: DC

## 2020-06-06 MED ORDER — FLUCONAZOLE 150 MG PO TABS: ORAL_TABLET | ORAL | 0 refills | Status: DC

## 2020-06-06 NOTE — Progress Notes (Signed)
Patient ID: Desiree Black, female    DOB: June 24, 1975, 45 y.o.   MRN: [id number]   No chief complaint on file.  Subjective:  CC: STD check   This is a new problem.  Presents today for sexually transmitted infection check.  Reports she has had positive chlamydia per Pap in 2019, and experiencing symptoms similar to that.  She describes a milky vaginal discharge, vaginal itching, and vaginal odor.  Last sexual activity approximately 1 month ago, reports having 2 regular menstrual cycles since then.  Reports urinary dysuria.  Pertinent negatives include no fever, no chills, no chest pain, no shortness of breath.   pt is having an abnormal vaginal odor for the past month. States she had std about 2 years ago and having same symptoms.   Medical History Desiree Black has a past medical history of Asthma, HPV (human papilloma virus) infection (09/2001), and PCOS (polycystic ovarian syndrome) (09/2001).   Outpatient Encounter Medications as of 06/06/2020  Medication Sig  . fluconazole (DIFLUCAN) 150 MG tablet Take one tablet by  Mouth today, may repeat in 3 days if symptoms persist.  . nitrofurantoin, macrocrystal-monohydrate, (MACROBID) 100 MG capsule Take 1 capsule (100 mg total) by mouth 2 (two) times daily.  . ondansetron (ZOFRAN ODT) 4 MG disintegrating tablet Take 1 tablet (4 mg total) by mouth every 8 (eight) hours as needed for nausea or vomiting.  . Vitamin D, Ergocalciferol, (DRISDOL) 1.25 MG (50000 UNIT) CAPS capsule Take 1 capsule (50,000 Units total) by mouth every 7 (seven) days.  . [DISCONTINUED] doxycycline (VIBRA-TABS) 100 MG tablet Take 1 tablet (100 mg total) by mouth 2 (two) times daily.  . [DISCONTINUED] sulfamethoxazole-trimethoprim (BACTRIM DS) 800-160 MG tablet Take 1 tablet by mouth 2 (two) times daily.   No facility-administered encounter medications on file as of 06/06/2020.     Review of Systems  Constitutional: Negative for chills and fever.  HENT: Negative for  congestion.   Respiratory: Negative for cough, chest tightness and shortness of breath.   Cardiovascular: Negative for chest pain.  Gastrointestinal: Negative for abdominal pain, diarrhea, nausea and vomiting.  Genitourinary: Positive for dysuria and vaginal discharge. Negative for flank pain, frequency, hematuria, menstrual problem, pelvic pain, urgency, vaginal bleeding and vaginal pain.       Has had 2 periods since last sexual activity Last day of LMP 05/27/20     Vitals BP 110/74   Pulse 92   Temp (!) 97.3 F (36.3 C)   Ht 5\' 7"  (1.702 m)   Wt 187 lb (84.8 kg)   SpO2 100%   BMI 29.29 kg/m   Objective:   Physical Exam Vitals reviewed.  Constitutional:      Appearance: Normal appearance.  Cardiovascular:     Rate and Rhythm: Normal rate and regular rhythm.     Heart sounds: Normal heart sounds.  Pulmonary:     Effort: Pulmonary effort is normal.     Breath sounds: Normal breath sounds.  Abdominal:     General: Bowel sounds are normal.     Tenderness: There is abdominal tenderness. There is no guarding.     Comments: LLQ and RLQ tenderness, states she get this about 3 days after period ends.   Skin:    General: Skin is warm and dry.  Neurological:     General: No focal deficit present.     Mental Status: She is alert.  Psychiatric:        Behavior: Behavior normal.    Results for  orders placed or performed in visit on 06/06/20  POCT urinalysis dipstick  Result Value Ref Range   Color, UA     Clarity, UA     Glucose, UA     Bilirubin, UA     Ketones, UA     Spec Grav, UA <=1.005 (A) 1.010 - 1.025   Blood, UA positive    pH, UA 5.0 5.0 - 8.0   Protein, UA Positive (A) Negative   Urobilinogen, UA     Nitrite, UA     Leukocytes, UA Moderate (2+) (A) Negative   Appearance     Odor    POCT urine pregnancy  Result Value Ref Range   Preg Test, Ur Negative Negative     Assessment and Plan   1. Chlamydia contact - POCT urinalysis dipstick - POCT urine  pregnancy - Chlamydia/Gonococcus/Trichomonas, NAA  2. Yeast infection - fluconazole (DIFLUCAN) 150 MG tablet; Take one tablet by  Mouth today, may repeat in 3 days if symptoms persist.  Dispense: 2 tablet; Refill: 0 - POCT urinalysis dipstick - POCT urine pregnancy - Chlamydia/Gonococcus/Trichomonas, NAA  3. Acute cystitis with hematuria - nitrofurantoin, macrocrystal-monohydrate, (MACROBID) 100 MG capsule; Take 1 capsule (100 mg total) by mouth 2 (two) times daily.  Dispense: 20 capsule; Refill: 0    Will treat for UTI and possible yeast infection today, hopefully her symptoms will resolve.    Agrees with plan of care discussed today. Understands warning signs to seek further care: chest pain, shortness of breath, any significant change in health.  Understands to follow-up if symptoms do no improve or worsen. Will notify when GC/Chlamydia results are available, treatment will be decided at that time.  Will treat UTI today and possible yeast infection.     Pecolia Ades, NP 06/06/2020

## 2020-06-06 NOTE — Patient Instructions (Signed)
Chlamydia, Female Chlamydia is an STI (sexually transmitted infection). This infection spreads through sexual contact. This condition is not hard to treat. But if it is not treated, it can cause worse health problems. You may have a higher risk of not being able to have children. Also, if you have untreated chlamydia and you are pregnant or get pregnant:  You can spread the infection to your baby during delivery.  Your baby may have health problems if he or she gets infected. What are the causes? This condition is caused by germs (bacteria) called Chlamydia trachomatis. These germs are spread from an infected partner during sex. The infection can spread through contact with the genitals, mouth, or butt. The infection can grow in:  The urethra. This is the part of the body that drains pee (urine) from the bladder.  The cervix. This is the lowest part of the womb, or uterus.  The throat.  The butt (rectum).   What increases the risk? The following factors may make you more likely to develop this condition:  Not using a condom the right way.  Not using a condom each time you have sex.  Having a new sex partner.  Having more than one sex partner.  Being female, age 43-25, and sexually active. What are the signs or symptoms? In some cases, there are no symptoms (you are asymptomatic), often early in the illness. If you get symptoms, they may include:  Peeing often, or a burning feeling when you pee.  Fluid (discharge) coming from the vagina.  Blood or fluid coming from the butt.  Redness, soreness, or swelling of the butt.  Belly pain or pain during sex.  Bleeding between monthly periods or irregular periods.  Itching, burning, or redness of the eyes.  Fluid coming from the eyes. How is this treated? This condition is treated with antibiotic medicines. If you are pregnant, you will not get some types of antibiotics. Follow these instructions at home: Sexual  activity  Tell your sex partner or partners about your infection. Sex partners are people you had oral, anal, or vaginal sex with within 60 days of when you started getting sick. They need treatment even if they do not feel or seem sick.  Do not have sex until: ? You and your sex partners have been treated. ? Your doctor says it is okay.  If you get just one dose of medicine, wait at least 7 days before having sex. General instructions  Take over-the-counter and prescription medicines as told by your doctor. Finish all antibiotic medicine even when you start to feel better.  It is up to you to get your test results. Ask your doctor, or the department that is doing the test, when your results will be ready.  Get a lot of rest.  Eat healthy foods.  Drink enough fluid to keep your pee pale yellow.  Keep all follow-up visits as told by your doctor. This is important. You may need tests after 3 months. How is this prevented? The only way to keep from getting infected is to not have vaginal, anal, or oral sex. To lower your risk:  Use latex condoms the right way. Do this every time you have sex.  Do not have many sex partners.  Ask if your sex partner got tested for STIs and had negative results.  Get regular health screenings to check for STIs.   Contact a doctor if:  You get new symptoms.  You do not get better  with treatment.  You have a fever or chills.  Sex is painful.  You get new joint pain or swelling near your joints.  Your periods are irregular.  You bleed between periods or after sex.  You get flu-like symptoms. These may be: ? Night sweats. ? Sore throat. ? Muscle aches.  You are pregnant and you get symptoms of chlamydia. Get help right away if:  Your pain gets worse and does not get better with medicine.  You have belly pain that does not get better with medicine.  You have lower back pain that does not get better with medicine.  You feel weak or  dizzy.  You faint. Summary  Chlamydia is an infection that spreads through sexual contact.  This condition is not hard to treat. But if it is not treated, it can cause worse health problems.  Sometimes, there are no symptoms of this condition, often early in the illness.  This condition is treated with antibiotic medicines.  Use latex condoms the right way. Do this every time you have sex. This information is not intended to replace advice given to you by your health care provider. Make sure you discuss any questions you have with your health care provider. Document Revised: 04/23/2019 Document Reviewed: 04/23/2019 Elsevier Patient Education  2021 [place] is an STI (sexually transmitted infection) that can infect both men and women. If left untreated, this infection can:  Damage the female or female reproductive organs.  Cause women and men to be unable to have children (infertility).  Harm an unborn baby if an infected woman is pregnant. It is important to get treatment for gonorrhea as soon as possible. All of your sex partners may also need to be treated for the infection. What are the causes? This condition is caused by bacteria called Neisseria gonorrhoeae. The infection is spread from person to person through sexual contact, including oral, anal, and vaginal sex. A newborn can get the infection from his or her mother during birth. What increases the risk? The following factors may make you more likely to develop this condition:  Being a woman who is younger than age 61 and who is sexually active.  Being a man who is gay or bisexual and who has sex with men.  Having a new sex partner or having multiple partners.  Having a sex partner who has an STI.  Not using condoms correctly or not using condoms every time you have sex.  Having a history of STIs.  Exchanging sex for money or drugs. What are the signs or symptoms? When symptoms occur, they  may be different for women and men. Symptoms may include:  Abnormal discharge from the penis or vagina. The discharge may be cloudy, thick, or yellow-green in color.  Pain or burning when you urinate.  Irritation, pain, bleeding, or discharge from the rectum. This may occur if the infection was spread by anal sex.  Sore throat or swollen lymph nodes in the neck. This may occur if the infection was spread by oral sex.  Pain or swelling in the testicles, in men.  Bleeding between menstrual periods, in women. In some cases, there are no symptoms. How is this diagnosed? This condition is diagnosed based on:  A physical exam.  A sample of discharge that is looked at under a microscope to find any bacteria. The discharge may be taken from the penis, vagina, throat, or rectum.  Urine tests. Not all test results will be available  during your visit. How is this treated? This condition is treated with antibiotic medicines. It is important to start treatment as soon as possible. Early treatment may prevent some problems from developing. You should not have sex during treatment. All types of sexual activity should be avoided for at least 7 days after treatment is complete and until your sex partner or partners have been treated. Follow these instructions at home:  Take over-the-counter and prescription medicines as told by your health care provider. Finish all antibiotic medicine even when you start to feel better.  Do not have sex during treatment.  Do not have sex until at least 7 days after you and your partner or partners have finished treatment and your health care provider says it is okay.  It is up to you to get your test results. Ask your health care provider, or the department that is doing the test, when your results will be ready.  If you get a positive result on your gonorrhea test, tell your recent sex partners. These include any partners for oral, anal, or vaginal sex. They need  to be checked for gonorrhea even if they do not have symptoms. They may need treatment, even if they get negative results on their gonorrhea tests.  Drink enough fluid to keep your urine pale yellow.  Keep all follow-up visits as told by your health care provider. This is important. How is this prevented?  Use latex condoms correctly every time you have sex.  Ask if your sex partner or partners have been tested for STIs and had negative results.  Avoid having multiple sex partners.  Get regular health screenings to check for STIs.   Contact a health care provider if:  Your symptoms do not get better after a few days of taking antibiotics.  Your symptoms get worse.  You develop new symptoms, including: ? Eye irritation. ? Joint pain or swelling. ? Unusual rashes.  You have a fever. Summary  Gonorrhea is an STI (sexually transmitted infection) that can infect both men and women.  This infection is spread from person to person through sexual contact, including oral, anal, and vaginal sex. A newborn can get the infection from his or her mother during birth.  Symptoms include abnormal discharge, pain or burning while urinating, or pain in the rectum.  This condition is treated with antibiotic medicines. Do not have sex until at least 7 days after both you and any sex partners have completed antibiotic treatment.  Tell your health care provider if your symptoms get worse or you have new symptoms. This information is not intended to replace advice given to you by your health care provider. Make sure you discuss any questions you have with your health care provider. Document Revised: 04/20/2019 Document Reviewed: 04/20/2019 Elsevier Patient Education  2021 [employer].

## 2020-06-08 LAB — CHLAMYDIA/GONOCOCCUS/TRICHOMONAS, NAA
Chlamydia by NAA: NEGATIVE
Gonococcus by NAA: NEGATIVE
Trich vag by NAA: POSITIVE — AB

## 2020-06-09 ENCOUNTER — Other Ambulatory Visit: Payer: Self-pay | Admitting: Family Medicine

## 2020-06-09 DIAGNOSIS — A599 Trichomoniasis, unspecified: Secondary | ICD-10-CM | POA: Insufficient documentation

## 2020-06-09 MED ORDER — METRONIDAZOLE 500 MG PO TABS: 500.0000 mg | ORAL_TABLET | Freq: Two times a day (BID) | ORAL | 0 refills | Status: DC

## 2020-06-09 NOTE — Progress Notes (Signed)
Treatment based on test.

## 2020-06-27 ENCOUNTER — Encounter: Payer: BC Managed Care – PPO | Admitting: Nurse Practitioner

## 2021-05-29 ENCOUNTER — Encounter: Payer: Self-pay | Admitting: Nurse Practitioner

## 2021-05-29 ENCOUNTER — Other Ambulatory Visit: Payer: Self-pay

## 2021-05-29 ENCOUNTER — Ambulatory Visit (INDEPENDENT_AMBULATORY_CARE_PROVIDER_SITE_OTHER): Payer: No Typology Code available for payment source | Admitting: Nurse Practitioner

## 2021-05-29 VITALS — BP 138/86 | Ht 67.0 in | Wt 181.6 lb

## 2021-05-29 DIAGNOSIS — F430 Acute stress reaction: Secondary | ICD-10-CM

## 2021-05-29 DIAGNOSIS — F32A Depression, unspecified: Secondary | ICD-10-CM

## 2021-05-29 DIAGNOSIS — F3281 Premenstrual dysphoric disorder: Secondary | ICD-10-CM | POA: Diagnosis not present

## 2021-05-29 DIAGNOSIS — F419 Anxiety disorder, unspecified: Secondary | ICD-10-CM

## 2021-05-29 MED ORDER — ESCITALOPRAM OXALATE 10 MG PO TABS: 10.0000 mg | ORAL_TABLET | Freq: Every day | ORAL | 0 refills | Status: DC

## 2021-05-29 NOTE — Progress Notes (Signed)
Subjective:    Patient ID: Desiree Black, female    DOB: 12-Mar-1976, 46 y.o.   MRN: [id number]  HPI  Patient arrives to discuss anxiety. Patient states a few years ago she would have flares especially during cycle. Patient states she has started a stressful job and she has been having problems with extreme flare and has went into a deep state of depression and almost wanted to end life on Wednesday-spoke with her mother to help but hit rock bottom Wednesday. Patient has been working from home for about 2 years.  Her current hours is about 10 hours/day.  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  Has gotten much worse lately with the stock market changes.  Patient relates that most of her feelings is related to this job.  Took a job initially because she wanted to move up the corporate ladder but with the shortage of workers she has been stuck in this position.  2 days ago several things happened at once which caused her to "hit rock bottom".  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  She was having particularly bad time at work.  Also on her cycle and has PMDD.  Patient did not attempt suicide but admits that she was tired of dealing with all this and reached out to her mother.  Her mother came and spoke with her, patient has not had these feelings since then but realizes something has to change in very soon.  States her sleep is usually very good but has struggled this week due to all the emotional issues.      Objective:   Physical Exam NAD.  Alert, oriented.  Moderately anxious affect.  Tearful and crying at times during visit.  Making good eye contact.  Dressed appropriately.  Speech clear.  Thoughts logical coherent and relevant.  Patient is also smiling at times near the end of the visit.   Lungs clear.  Heart regular rate and rhythm.  Today's Vitals   05/29/21 1337  BP: 138/86  Weight: 181 lb 9.6 oz (82.4 kg)  Height: 5\' 7"  (1.702 m)   Body mass index is 28.44 kg/m.        Assessment & Plan:   Problem List Items Addressed This Visit       Other   Acute reaction to situational stress   Relevant Medications   escitalopram (LEXAPRO) 10 MG tablet   Anxiety and depression - Primary   Relevant Medications   escitalopram (LEXAPRO) 10 MG tablet   PMDD (premenstrual dysphoric disorder)   Relevant Medications   escitalopram (LEXAPRO) 10 MG tablet   Meds ordered this encounter  Medications   escitalopram (LEXAPRO) 10 MG tablet    Sig: Take 1 tablet (10 mg total) by mouth daily.    Dispense:  30 tablet    Refill:  0    Order Specific Question:   Supervising Provider    Answer:   Sallee Lange A [[id number]]   Reviewed potential side effects of escitalopram including GI side effects and rare risk of suicidality.  Patient verbalizes understanding.  Discontinue medication and contact office if any significant problems. 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  Encourage patient to continue looking at other positions including those outside of her current field. Discussed options for counseling.  Patient will see who is preferred providers with her insurance.  If she decides to get counseling, call us if she needs a referral. Discussed importance of regular activity adequate sleep and stress reduction. Return in about 1 month (around 06/29/2021). Call back sooner if needed.

## 2021-06-01 ENCOUNTER — Telehealth: Payer: Self-pay | Admitting: Nurse Practitioner

## 2021-06-01 NOTE — Telephone Encounter (Signed)
Patient brought in FMLA to be completed in your yellow folder

## 2021-06-12 ENCOUNTER — Telehealth: Payer: Self-pay | Admitting: Nurse Practitioner

## 2021-06-12 NOTE — Telephone Encounter (Signed)
[employer]ne-with principal disability would like to know if patient can do her job if she was under different supervision with her employer. He like to speak with you [phone]

## 2021-06-12 NOTE — Telephone Encounter (Signed)
Done

## 2021-06-22 ENCOUNTER — Encounter: Payer: Self-pay | Admitting: Nurse Practitioner

## 2021-06-22 NOTE — Telephone Encounter (Signed)
Principal Disability called again stating they need to speak with you concerning this disability claim

## 2021-06-23 NOTE — Telephone Encounter (Signed)
Left message to return call on 06/23/21 at 8:35 am

## 2021-06-26 NOTE — Telephone Encounter (Signed)
With patient's verbal permission, spoke with the representative on 06/22/21

## 2021-07-03 ENCOUNTER — Ambulatory Visit: Payer: No Typology Code available for payment source | Admitting: Nurse Practitioner

## 2022-05-07 ENCOUNTER — Encounter: Payer: Self-pay | Admitting: Nurse Practitioner

## 2022-05-07 ENCOUNTER — Ambulatory Visit (INDEPENDENT_AMBULATORY_CARE_PROVIDER_SITE_OTHER): Payer: No Typology Code available for payment source | Admitting: Nurse Practitioner

## 2022-05-07 VITALS — BP 132/88 | HR 72 | Temp 98.6°F | Wt 184.0 lb

## 2022-05-07 DIAGNOSIS — L42 Pityriasis rosea: Secondary | ICD-10-CM

## 2022-05-07 NOTE — Progress Notes (Signed)
   Subjective:    Patient ID: Desiree Black, female    DOB: [birth year], 46 y.o.   MRN: [id number]  HPI Patient arrives today with spots all over her body. Rash began about 3 weeks ago.  Started first with a lesion on the top of her left foot.  This began to fade and the rest of the rash began.  Mainly on her trunk.  Minimal lesions on her upper arms or upper legs.  No lesions on the face or hands.  Started off moderately pruritic although the itching has resolved.  No fever.  No known contacts.  No known allergens.  Patient wonders if it was related to some of the glue they were using to put up wallpaper.  Lesions are beginning to fade.   Review of Systems  Constitutional:  Negative for fever.  HENT:  Negative for trouble swallowing.   Respiratory:  Negative for cough, chest tightness, shortness of breath and wheezing.   Cardiovascular:  Negative for chest pain.  Skin:  Positive for rash.       Objective:   Physical Exam NAD.  Alert, oriented.  Calm cheerful affect.  Lungs clear.  Heart regular rate rhythm.  Multiple hyperpigmented mostly oval to round lesions noted on the lower back area and abdomen.  Skin is very dry.  No excoriation noted.  Lesions are fairly symmetrical noted on both sides of the body.  Faint pink of the lesions are noted on the upper chest area.  None on the face.  There is a fading hyperpigmented round lesion on the dorsal aspect of the left foot. Today's Vitals   05/07/22 1536  BP: 132/88  Pulse: 72  Temp: 98.6 F (37 C)  TempSrc: Oral  SpO2: 98%  Weight: 184 lb (83.5 kg)   Body mass index is 28.82 kg/m.       Assessment & Plan:   Problem List Items Addressed This Visit       Musculoskeletal and Integument   Pityriasis rosea - Primary   Given samples of CeraVe lotion for dry skin. Discussed usual course of pityriasis rosea.  Expect slow gradual resolution of lesions but it may take weeks for the pigmentation to return to normal.  Patient  verbalizes understanding.  Recheck if any new or worsening symptoms otherwise expect gradual resolution.

## 2022-05-07 NOTE — Patient Instructions (Addendum)
Pityriasis rosea

## 2024-02-03 ENCOUNTER — Encounter: Payer: Self-pay | Admitting: Nurse Practitioner
